# Patient Record
Sex: Male | Born: 1935 | Race: White | Hispanic: No | Marital: Married | Smoking: Former smoker
Health system: Southern US, Community
[De-identification: ages and names within clinical notes are randomized; demographics above are authoritative.]

## PROBLEM LIST (undated history)

## (undated) DIAGNOSIS — C801 Malignant (primary) neoplasm, unspecified: Secondary | ICD-10-CM

## (undated) DIAGNOSIS — I10 Essential (primary) hypertension: Secondary | ICD-10-CM

---

## 2000-03-26 ENCOUNTER — Encounter: Admission: RE | Admit: 2000-03-26 | Discharge: 2000-03-26 | Payer: Self-pay

## 2000-04-16 ENCOUNTER — Ambulatory Visit (HOSPITAL_COMMUNITY): Admission: RE | Admit: 2000-04-16 | Discharge: 2000-04-16 | Payer: Self-pay | Admitting: Gastroenterology

## 2008-10-18 ENCOUNTER — Encounter: Admission: RE | Admit: 2008-10-18 | Discharge: 2008-10-18 | Payer: Self-pay | Admitting: Family Medicine

## 2008-11-29 ENCOUNTER — Inpatient Hospital Stay (HOSPITAL_COMMUNITY): Admission: RE | Admit: 2008-11-29 | Discharge: 2008-12-02 | Payer: Self-pay | Admitting: Orthopedic Surgery

## 2010-09-25 LAB — CROSSMATCH
ABO/RH(D): B POS
Antibody Screen: NEGATIVE

## 2010-09-25 LAB — BASIC METABOLIC PANEL
BUN: 12 mg/dL (ref 6–23)
BUN: 8 mg/dL (ref 6–23)
BUN: 8 mg/dL (ref 6–23)
CO2: 28 mEq/L (ref 19–32)
CO2: 29 mEq/L (ref 19–32)
Calcium: 8 mg/dL — ABNORMAL LOW (ref 8.4–10.5)
Calcium: 8.2 mg/dL — ABNORMAL LOW (ref 8.4–10.5)
Chloride: 100 mEq/L (ref 96–112)
Chloride: 102 mEq/L (ref 96–112)
Creatinine, Ser: 0.78 mg/dL (ref 0.4–1.5)
Creatinine, Ser: 0.79 mg/dL (ref 0.4–1.5)
Creatinine, Ser: 0.72 mg/dL (ref 0.4–1.5)
GFR calc Af Amer: >60 mL/min (ref >60)
GFR calc Af Amer: >60 mL/min (ref >60)
GFR calc non Af Amer: >60 mL/min (ref >60)
GFR calc non Af Amer: >60 mL/min (ref >60)
GFR calc non Af Amer: >60 mL/min (ref >60)
Glucose, Bld: 111 mg/dL — ABNORMAL HIGH (ref 70–99)
Glucose, Bld: 149 mg/dL — ABNORMAL HIGH (ref 70–99)
Glucose, Bld: 126 mg/dL — ABNORMAL HIGH (ref 70–99)
Potassium: 3.4 mEq/L — ABNORMAL LOW (ref 3.5–5.1)
Potassium: 4 mEq/L (ref 3.5–5.1)
Potassium: 3.3 mEq/L — ABNORMAL LOW (ref 3.5–5.1)
Sodium: 135 mEq/L (ref 135–145)
Sodium: 136 mEq/L (ref 135–145)

## 2010-09-25 LAB — CBC
HCT: 27.8 % — ABNORMAL LOW (ref 39.0–52.0)
HCT: 31.1 % — ABNORMAL LOW (ref 39.0–52.0)
HCT: 41 % (ref 39.0–52.0)
Hemoglobin: 9.5 g/dL — ABNORMAL LOW (ref 13.0–17.0)
MCHC: 34.2 g/dL (ref 30.0–36.0)
MCV: 96.7 fL (ref 78.0–100.0)
MCV: 95.2 fL (ref 78.0–100.0)
MCV: 96 fL (ref 78.0–100.0)
Platelets: 191 10*3/uL (ref 150–400)
Platelets: 192 10*3/uL (ref 150–400)
Platelets: 211 10*3/uL (ref 150–400)
Platelets: 223 10*3/uL (ref 150–400)
RBC: 2.77 MIL/uL — ABNORMAL LOW (ref 4.22–5.81)
RBC: 2.87 MIL/uL — ABNORMAL LOW (ref 4.22–5.81)
RBC: 4.31 MIL/uL (ref 4.22–5.81)
RDW: 13.3 % (ref 11.5–15.5)
RDW: 13.6 % (ref 11.5–15.5)
WBC: 10.6 10*3/uL — ABNORMAL HIGH (ref 4.0–10.5)
WBC: 7.7 10*3/uL (ref 4.0–10.5)
WBC: 8.1 10*3/uL (ref 4.0–10.5)

## 2010-09-25 LAB — COMPREHENSIVE METABOLIC PANEL
AST: 19 U/L (ref 0–37)
Albumin: 3.5 g/dL (ref 3.5–5.2)
Alkaline Phosphatase: 69 U/L (ref 39–117)
BUN: 13 mg/dL (ref 6–23)
CO2: 27 mEq/L (ref 19–32)
Chloride: 102 mEq/L (ref 96–112)
Creatinine, Ser: 0.74 mg/dL (ref 0.4–1.5)
GFR calc Af Amer: >60 mL/min (ref >60)
GFR calc non Af Amer: >60 mL/min (ref >60)
Potassium: 3.9 mEq/L (ref 3.5–5.1)
Total Bilirubin: 0.7 mg/dL (ref 0.3–1.2)

## 2010-09-25 LAB — URINE CULTURE
Colony Count: NO GROWTH
Culture: NO GROWTH

## 2010-09-25 LAB — URINALYSIS, ROUTINE W REFLEX MICROSCOPIC
Bilirubin Urine: NEGATIVE
Glucose, UA: NEGATIVE mg/dL
Hgb urine dipstick: NEGATIVE
Ketones, ur: NEGATIVE mg/dL
Protein, ur: NEGATIVE mg/dL
pH: 5.5 (ref 5.0–8.0)

## 2010-09-25 LAB — PROTIME-INR: Prothrombin Time: 15.3 seconds — ABNORMAL HIGH (ref 11.6–15.2)

## 2010-09-25 LAB — DIFFERENTIAL
Basophils Absolute: 0 10*3/uL (ref 0.0–0.1)
Basophils Relative: 0 % (ref 0–1)
Eosinophils Absolute: 0 10*3/uL (ref 0.0–0.7)
Eosinophils Relative: 0 % (ref 0–5)
Lymphocytes Relative: 18 % (ref 12–46)
Monocytes Absolute: 0.9 10*3/uL (ref 0.1–1.0)

## 2010-10-31 NOTE — Op Note (Signed)
NAMEERMON, SAGAN NO.:  [id number]   MEDICAL RECORD NO.:  [id number]          PATIENT TYPE:  INP   LOCATION:  2899                         FACILITY:  [facility]   PHYSICIAN:  Mila Homer. Sherlean Foot, M.D. DATE OF BIRTH:  17-Dec-1927   DATE OF PROCEDURE:  DATE OF DISCHARGE:                               OPERATIVE REPORT   SURGEON:  Mila Homer. Sherlean Foot, MD   ASSISTANTS:  1. Altamese Cabal, PA-C  2. Skip Mayer, PA-C   ANESTHESIA:  General.   PREOPERATIVE DIAGNOSIS:  Right knee osteoarthritis.   POSTOPERATIVE DIAGNOSIS:  Right knee osteoarthritis.   PROCEDURE:  Right total knee arthroplasty.   INDICATIONS FOR PROCEDURE:  The patient is a 77 year old white male with  failure conservative measures for osteoarthritis of the right knee.  Informed consent obtained.   DESCRIPTION OF PROCEDURE:  The patient was laid supine, administered  general anesthesia, and right leg was prepped and draped in usual  sterile fashion.  A midline incision was made with a #10 blade.  Nu-  Blade was used to make a median parapatellar arthrotomy to perform a  synovectomy.  I elevated the deep MCL off the medial crest of the tibia  around the semimembranosus tendon.  I then everted the patella and  measured 26 mm thick.  I reamed down 10 mm, drilled 3 lug holes through  the 35-mm template.  I then removed the trial patella and went into  flexion.  I used the extramedullary alignment system on the tibia to  make perpendicular cut to the anatomic axis.  I then used the  intramedullary drill in the femur and used to 6-degree valgus  intramedullary alignment and fastened a distal femoral cutting block in  6 degrees of valgus.  I made the distal femoral cut with sagittal saw.  I marked out the epicondylar axis.  I sized to a size D.  There was a  definite anterior to posterior to medial and lateral mismatch, but I  went with the D anyway to avoid notching.  I then made the anterior-  posterior  chamfer cuts through the cutting block.  At this point, I then  placed a 10-mm spacer block in the knee.  Had good flexion and extension  gap balance.  I removed the ACL, PCL, medial and lateral menisci,  posterior condylar osteophytes.  I then finished the tibia with size 5  tibial tray drilling keel and finished the femur with a size D finishing  block.  I then trialed with the D femur, 5 tibia, 10- and 12-mm poly and  chose the 12, and a 35-mm patella and had good flexion and extension gap  balance, good patellar tracking.  I then removed the trial component and  copiously irrigated.  I then cemented the components removing excess  cement.  I allowed the cement to hard in extension, placed a Hemovac  coming out superolateral and deep to the arthrotomy, pain catheter  coming out superomedially and superficial to the arthrotomy.  Let  tourniquet down.  Once the cement was hardened, we obtained hemostasis,  copiously irrigated.  I then closed the arthrotomy with figure-of-eight  #1 Vicryl sutures, closed the deep soft tissues with buried 0 Vicryl  sutures, subcuticular 2-0 Vicryl stitch, and skin with staples.  Dressed  with Xeroform dressing, sponges, sterile Webril, and TED stocking.   COMPLICATIONS:  None.   DRAINS:  One Hemovac, one Foley catheter.   ESTIMATED BLOOD LOSS:  300 mL.   TOURNIQUET TIME:  52 minutes.            ______________________________  Mila Homer Sherlean Foot, M.D.     SDL/MEDQ  D:  11/29/2008  T:  11/30/2008  Job:  [id number]

## 2010-11-03 NOTE — Procedures (Signed)
[facility]  Patient:    [name] Ferrell, [name] Ferrell                     MRN: [id number] Proc. Date: 04/16/00 Adm. Date:  [id number] Attending:  Orland Mustard CC:         Milus Mallick, M.D.  Arvella Merles, M.D.   Procedure Report  PROCEDURE:  Colonoscopy.  MEDICATIONS:  Fentanyl 75 mcg, Versed 6 mg IV.  SCOPE:  Pediatric video colonoscope.  INDICATION:  A nice man who has had abdominal pain.  CT scan showed a soft tissue density in the inferior transverse mesocolon that was possibly diverticulitis.  He has diffuse diverticular disease.  Cannot be sure this is not a tumor.  He has had some sort of infected umbilical herniation.  Given the abnormal CT, colonoscopy is performed to rule out neoplasm or active diverticulitis in that area.  DESCRIPTION OF PROCEDURE:  The procedure had been explained to the patient and consent obtained.  With the patient in the left lateral decubitus position, the pediatric video colonoscope was inserted and advanced under direct visualization.  The prep was adequate.  The patient did have still retained large balls of stool throughout the colon despite the prep.  We were able to advance over to the cecum without a large amount of difficulty.  Although there was pan-diverticular disease throughout, particularly in the sigmoid and left colon, we were able to pass the scope easily.  The ileocecal valve was seen.  The scope was withdrawn, and there were diverticula seen in the right colon, a fair amount of diverticular in the transverse colon but no active diverticulitis or tumor mass.  The descending and sigmoid colon also showed multiple large- and small-mouthed diverticula, no polyps or other lesions. Scope withdrawn.  The patient tolerated the procedure well.  ASSESSMENT:  Pan-diverticulosis with no evidence of active diverticulitis or tumor.  PLAN:  Will treat conservatively with fiber b.i.d., give a handout  on diverticular disease, and see back in the office in two months. DD:  04/16/00 TD:  04/16/00 Job: [id number]

## 2010-11-03 NOTE — Discharge Summary (Signed)
[name], [name] NO.:  [id number]   MEDICAL RECORD NO.:  [id number]          PATIENT TYPE:  INP   LOCATION:  5019                         FACILITY:  [facility]   PHYSICIAN:  Mila Homer. Sherlean Foot, M.D. DATE OF BIRTH:  1928/04/15   DATE OF ADMISSION:  11/29/2008  DATE OF DISCHARGE:  12/02/2008                               DISCHARGE SUMMARY   ADMISSION DIAGNOSIS:  Osteoarthritis of the right knee.   DISCHARGE DIAGNOSES:  1. Osteoarthritis of the right knee, status post right total knee      arthroplasty.  2. Acute blood loss anemia status post surgery.  3. Benign prostatic hypertrophy, trouble voiding.   PROCEDURE:  Right total knee arthroplasty.   HISTORY:  The patient is a 77 year old male with complaints of constant  severe pain in the right knee.  Pain started to interfere with  activities of daily living.  Conservative treatment has failed.  Risks  and benefits of surgery were discussed with the patient.  The patient  would like to proceed with the right total knee.   ALLERGIES:  The patient has no known drug allergies.   ADMISSION MEDICATIONS:  Upon admission to the hospital, the patient was  taking Benadryl 25 mg at bedtime; vitamin C 500 daily; Tylenol 650 day  and night, Extended Release; Omega-3 fish oil 1000 daily; fiber  laxatives in the morning; glucose chondroitin once in the morning, once  at night; vitamin D3 2000 daily, B12 250 mcg daily; aspirin 81 mg daily;  __________ 25 daily; lisinopril 10 daily; Colace at bedtime.   HOSPITAL COURSE:  This is a 77 year old male admitted to the hospital on  November 29, 2008.  After appropriate laboratory studies were taken  preoperatively as well as Ancef on-call to the operating room, he was  taken to the OR where he underwent a right TKA.  He tolerated the  procedure well and was taken to the PACU in good condition.  The patient  was placed on p.o. pain medications as well as Dilaudid as needed IV and  Foley  was placed intraoperatively.   Postop day #1 vital signs were stable.  The patient denied chest pain,  shortness of breath, or calf pain.  The patient was started on Lovenox  30 mg subcu q.12 h. at 8 a.m.  Consults to PT, OT, and Care Management  were made.  The patient is weightbearing as tolerated.  CPM 0 to 90  degrees 6-8 hours per day.  Studies were incentive spirometry teaching.   Postop day #2, the patient continued progressive physical therapy.  Dressing was changed.  Marcaine pump and Hemovac were discontinued.  Foley was discontinued, but then re-continued because of trouble  voiding.  He was also given some Flomax to help void.   The patient continued to progress in physical therapy postop day #3.  Fosamax helped and was voiding normally and was able to be discharged  after Foley was discontinued, and he was voiding normally.  The patient  was discharged home after Lovenox teaching.   LABORATORY STUDIES:  Postop day #1, white blood cell count  9.2, H and H  10.8 and 31.1, platelets were 124.  Sodium was 134, potassium was 3.3,  he was given Kcl p.o., chloride was 99, CO2 was 31, BUN was 8,  creatinine was 0.72, glucose was 126.  Upon discharge from the hospital,  the patient's white blood cell count was 7.7, H and H was 9.2 and 26.5,  platelets 191.  Sodium was 136, potassium was 4.0, 102 of chloride, CO2  was 29, BUN was 8, creatinine 0.78, glucose was 111.   DISCHARGE INSTRUCTIONS:  There are no restrictions to diet.  He is to  follow blue instruction sheet for wound care.  Increase activity slowly.  May use a cane or walker, weightbearing as tolerated.  No lifting or  driving for 6 weeks.  Home health CPM 0 to 90 degrees for 6-8 hours per  day and compression hose bilateral x3 weeks.   Upon discharge from the hospital, the patient was given Lovenox 40 mg,  inject one subcu daily, last dose December 13, 2008; Vicodin 5/500 one to  two tablets every 4-6 hours as needed for  pain, #60; Robaxin 500 mg one  to two tablets every 6-8 hours as needed for spasm; and was also  continued on some Flomax 0.4 mg one daily x2 weeks.   The patient will follow with Dr. Sherlean Foot on December 14, 2008.  Call for  appointment [phone].  Discharged in improved condition.     ______________________________  Altamese Cabal, PA-C    ______________________________  Mila Homer. Sherlean Foot, M.D.    MJ/MEDQ  D:  01/14/2009  T:  01/15/2009  Job:  [id number]

## 2014-06-13 ENCOUNTER — Emergency Department (HOSPITAL_COMMUNITY): Payer: Commercial Managed Care - HMO

## 2014-06-13 ENCOUNTER — Inpatient Hospital Stay (HOSPITAL_COMMUNITY)
Admission: EM | Admit: 2014-06-13 | Discharge: 2014-06-21 | DRG: 660 | Disposition: A | Discharge status: Skilled Nursing Facility | Payer: Commercial Managed Care - HMO | Attending: Internal Medicine | Admitting: Internal Medicine

## 2014-06-13 ENCOUNTER — Encounter (HOSPITAL_COMMUNITY): Payer: Self-pay | Admitting: *Deleted

## 2014-06-13 DIAGNOSIS — C61 Malignant neoplasm of prostate: Secondary | ICD-10-CM | POA: Diagnosis present

## 2014-06-13 DIAGNOSIS — R531 Weakness: Secondary | ICD-10-CM

## 2014-06-13 DIAGNOSIS — Z801 Family history of malignant neoplasm of trachea, bronchus and lung: Secondary | ICD-10-CM | POA: Diagnosis not present

## 2014-06-13 DIAGNOSIS — E875 Hyperkalemia: Secondary | ICD-10-CM | POA: Diagnosis present

## 2014-06-13 DIAGNOSIS — N139 Obstructive and reflux uropathy, unspecified: Secondary | ICD-10-CM | POA: Diagnosis present

## 2014-06-13 DIAGNOSIS — D62 Acute posthemorrhagic anemia: Secondary | ICD-10-CM

## 2014-06-13 DIAGNOSIS — N131 Hydronephrosis with ureteral stricture, not elsewhere classified: Secondary | ICD-10-CM

## 2014-06-13 DIAGNOSIS — R339 Retention of urine, unspecified: Secondary | ICD-10-CM | POA: Diagnosis present

## 2014-06-13 DIAGNOSIS — N320 Bladder-neck obstruction: Secondary | ICD-10-CM | POA: Diagnosis present

## 2014-06-13 DIAGNOSIS — I10 Essential (primary) hypertension: Secondary | ICD-10-CM | POA: Diagnosis present

## 2014-06-13 DIAGNOSIS — Z7982 Long term (current) use of aspirin: Secondary | ICD-10-CM | POA: Diagnosis not present

## 2014-06-13 DIAGNOSIS — N390 Urinary tract infection, site not specified: Secondary | ICD-10-CM | POA: Diagnosis not present

## 2014-06-13 DIAGNOSIS — Z87891 Personal history of nicotine dependence: Secondary | ICD-10-CM | POA: Diagnosis not present

## 2014-06-13 DIAGNOSIS — N179 Acute kidney failure, unspecified: Secondary | ICD-10-CM | POA: Diagnosis present

## 2014-06-13 DIAGNOSIS — N1330 Unspecified hydronephrosis: Secondary | ICD-10-CM | POA: Diagnosis present

## 2014-06-13 DIAGNOSIS — Z807 Family history of other malignant neoplasms of lymphoid, hematopoietic and related tissues: Secondary | ICD-10-CM | POA: Diagnosis not present

## 2014-06-13 DIAGNOSIS — C7989 Secondary malignant neoplasm of other specified sites: Secondary | ICD-10-CM

## 2014-06-13 DIAGNOSIS — R197 Diarrhea, unspecified: Secondary | ICD-10-CM | POA: Diagnosis present

## 2014-06-13 DIAGNOSIS — E872 Acidosis: Secondary | ICD-10-CM | POA: Diagnosis present

## 2014-06-13 DIAGNOSIS — R310 Gross hematuria: Secondary | ICD-10-CM | POA: Diagnosis present

## 2014-06-13 HISTORY — DX: Essential (primary) hypertension: I10

## 2014-06-13 HISTORY — DX: Malignant (primary) neoplasm, unspecified: C80.1

## 2014-06-13 LAB — CBC WITH DIFFERENTIAL/PLATELET
Basophils Absolute: 0 10*3/uL (ref 0.0–0.1)
Basophils Relative: 0 % (ref 0–1)
EOS ABS: 0.1 10*3/uL (ref 0.0–0.7)
EOS PCT: 1 % (ref 0–5)
HEMATOCRIT: 26.7 % — AB (ref 39.0–52.0)
Hemoglobin: 8.9 g/dL — ABNORMAL LOW (ref 13.0–17.0)
LYMPHS ABS: 1 10*3/uL (ref 0.7–4.0)
LYMPHS PCT: 7 % — AB (ref 12–46)
MCH: 29.3 pg (ref 26.0–34.0)
MCHC: 33.3 g/dL (ref 30.0–36.0)
MCV: 87.8 fL (ref 78.0–100.0)
MONO ABS: 1.1 10*3/uL — AB (ref 0.1–1.0)
MONOS PCT: 7 % (ref 3–12)
Neutro Abs: 12.8 10*3/uL — ABNORMAL HIGH (ref 1.7–7.7)
Neutrophils Relative %: 85 % — ABNORMAL HIGH (ref 43–77)
PLATELETS: 345 10*3/uL (ref 150–400)
RBC: 3.04 MIL/uL — AB (ref 4.22–5.81)
RDW: 14.6 % (ref 11.5–15.5)
WBC: 15 10*3/uL — ABNORMAL HIGH (ref 4.0–10.5)

## 2014-06-13 LAB — URINALYSIS, ROUTINE W REFLEX MICROSCOPIC
Glucose, UA: NEGATIVE mg/dL
Ketones, ur: 15 mg/dL — AB
NITRITE: POSITIVE — AB
PROTEIN: 100 mg/dL — AB
SPECIFIC GRAVITY, URINE: 1.016 (ref 1.005–1.030)
UROBILINOGEN UA: 0.2 mg/dL (ref 0.0–1.0)
pH: 5.5 (ref 5.0–8.0)

## 2014-06-13 LAB — COMPREHENSIVE METABOLIC PANEL
ALT: 18 U/L (ref 0–53)
ANION GAP: 20 — AB (ref 5–15)
AST: 16 U/L (ref 0–37)
Albumin: 2.7 g/dL — ABNORMAL LOW (ref 3.5–5.2)
Alkaline Phosphatase: 88 U/L (ref 39–117)
BUN: 145 mg/dL — AB (ref 6–23)
CALCIUM: 8.5 mg/dL (ref 8.4–10.5)
CO2: 15 mmol/L — AB (ref 19–32)
CREATININE: 13.41 mg/dL — AB (ref 0.50–1.35)
Chloride: 96 mEq/L (ref 96–112)
GFR, EST AFRICAN AMERICAN: 3 mL/min — AB (ref >90)
GFR, EST NON AFRICAN AMERICAN: 3 mL/min — AB (ref >90)
GLUCOSE: 81 mg/dL (ref 70–99)
Potassium: 6.5 mmol/L (ref 3.5–5.1)
Sodium: 131 mmol/L — ABNORMAL LOW (ref 135–145)
TOTAL PROTEIN: 6.4 g/dL (ref 6.0–8.3)
Total Bilirubin: 1.6 mg/dL — ABNORMAL HIGH (ref 0.3–1.2)

## 2014-06-13 LAB — URINE MICROSCOPIC-ADD ON

## 2014-06-13 MED ORDER — INSULIN ASPART 100 UNIT/ML IV SOLN
5.0000 [IU] | Freq: Once | INTRAVENOUS | Status: AC
  Administered 2014-06-13: 5 [IU] via INTRAVENOUS
  Filled 2014-06-13: qty 1

## 2014-06-13 MED ORDER — SODIUM BICARBONATE 8.4 % IV SOLN
50.0000 meq | Freq: Once | INTRAVENOUS | Status: AC
  Administered 2014-06-13: 50 meq via INTRAVENOUS
  Filled 2014-06-13: qty 50

## 2014-06-13 MED ORDER — ALBUTEROL SULFATE (2.5 MG/3ML) 0.083% IN NEBU
5.0000 mg | INHALATION_SOLUTION | Freq: Once | RESPIRATORY_TRACT | Status: AC
  Administered 2014-06-13: 5 mg via RESPIRATORY_TRACT
  Filled 2014-06-13: qty 6

## 2014-06-13 MED ORDER — MORPHINE SULFATE 4 MG/ML IJ SOLN
4.0000 mg | Freq: Once | INTRAMUSCULAR | Status: AC
  Administered 2014-06-13: 4 mg via INTRAVENOUS
  Filled 2014-06-13: qty 1

## 2014-06-13 MED ORDER — DEXTROSE 5 % IV SOLN
1.0000 g | Freq: Once | INTRAVENOUS | Status: AC
  Administered 2014-06-13: 1 g via INTRAVENOUS
  Filled 2014-06-13: qty 10

## 2014-06-13 MED ORDER — SODIUM CHLORIDE 0.9 % IV BOLUS (SEPSIS)
500.0000 mL | Freq: Once | INTRAVENOUS | Status: AC
  Administered 2014-06-13: 500 mL via INTRAVENOUS

## 2014-06-13 MED ORDER — DEXTROSE 50 % IV SOLN
1.0000 | Freq: Once | INTRAVENOUS | Status: AC
  Administered 2014-06-13: 50 mL via INTRAVENOUS
  Filled 2014-06-13: qty 50

## 2014-06-13 NOTE — ED Notes (Signed)
The pt has been diagnosed with prostate caner  For the second episode.  He has not been able to void since last pm.  He has swollen legs and body weakness.

## 2014-06-13 NOTE — ED Notes (Signed)
Dr. Alvino Chapel at bedside, notified of critical potassium.

## 2014-06-13 NOTE — ED Notes (Signed)
MD at bedside. 

## 2014-06-13 NOTE — ED Notes (Signed)
Dr. Roel Cluck, hospitalist, at the bedside.

## 2014-06-13 NOTE — H&P (Signed)
PCP: [facility]   Chief Complaint:  No urine output  HPI: [name] Ferrell is a 80 y.o. male   has a past medical history of Cancer and Hypertension.   Presented with  Patient report difficulty with urination last time he urinated was 3 days ago. Patient was noting that his ankles were swelling.  Patient Presented to ER and Foley was placed producing at least 2.5 of dark brown urine. He was found to have cr of 13.41 and K of 6.5. He was treated with albuterol, insulin and d50, bicarb Patient reports he have had some diarrhea and so did his wife but she was tested and found to be c.dif negative.  Patient has hx of prostate cancer followed by urology with recently rising PSA and worsening hematouria  Hospitalist was called for admission for acute renal failure due to obstruction  Review of Systems:    Pertinent positives include:  chills, no urine output, diarrhea,   Constitutional:  No weight loss, night sweats, Fevers, fatigue, weight loss  HEENT:  No headaches, Difficulty swallowing,Tooth/dental problems,Sore throat,  No sneezing, itching, ear ache, nasal congestion, post nasal drip,  Cardio-vascular:  No chest pain, Orthopnea, PND, anasarca, dizziness, palpitations.no Bilateral lower extremity swelling  GI:  No heartburn, indigestion, abdominal pain, nausea, vomiting, change in bowel habits, loss of appetite, melena, blood in stool, hematemesis Resp:  no shortness of breath at rest. No dyspnea on exertion, No excess mucus, no productive cough, No non-productive cough, No coughing up of blood.No change in color of mucus.No wheezing. Skin:  no rash or lesions. No jaundice GU:  no dysuria, change in color of urine, no urgency or frequency. No straining to urinate.  No flank pain.  Musculoskeletal:  No joint pain or no joint swelling. No decreased range of motion. No back pain.  Psych:  No change in mood or affect. No depression or anxiety. No memory loss.    Neuro: no localizing neurological complaints, no tingling, no weakness, no double vision, no gait abnormality, no slurred speech, no confusion  Otherwise ROS are negative except for above, 10 systems were reviewed  Past Medical History: Past Medical History  Diagnosis Date  . Cancer   . Hypertension    History reviewed. No pertinent past surgical history.   Medications: Prior to Admission medications   Medication Sig Start Date End Date Taking? Authorizing Provider  aspirin 81 MG tablet Take 81 mg by mouth daily.   Yes Historical Provider, MD  lisinopril (PRINIVIL,ZESTRIL) 10 MG tablet Take 10 mg by mouth daily.   Yes Historical Provider, MD    Allergies:   Allergies  Allergen Reactions  . 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 He does not have any smokeless tobacco history on file. He reports that he drinks alcohol. He reports that he does not use illicit drugs.    Family History: family history includes Lung cancer in his father; Lymphoma in his mother.    Physical Exam: Patient Vitals for the past 24 hrs:  BP Temp Temp src Pulse Resp SpO2 Height Weight  06/13/14 2315 (!) 133/48 mmHg - - 95 18 99 % - -  06/13/14 2247 (!) 132/52 mmHg - - 96 20 94 % - -  06/13/14 2200 132/56 mmHg - - 83 12 100 % - -  06/13/14 2145 Marland Kitchen)  131/51 mmHg - - 81 15 100 % - -  06/13/14 2132 - - - 84 14 99 % - -  06/13/14 2131 (!) 134/48 mmHg - - - 19 - - -  06/13/14 2045 (!) 129/54 mmHg 97.9 F (36.6 C) Oral 88 16 100 % 5\' 11"  (1.803 m) 78.472 kg (173 lb)    1. General:  in No Acute distress 2. Psychological: Alert and  Oriented 3. Head/ENT:   Dry Mucous Membranes                          Head Non traumatic, neck supple                          Normal   Dentition 4. SKIN:   decreased Skin turgor,  Skin clean Dry and intact no rash 5. Heart: Regular rate and rhythm no  Murmur, Rub or gallop 6. Lungs: Clear to auscultation bilaterally, no wheezes or crackles   7. Abdomen: Soft, non-tender, Non distended 8. Lower extremities: no clubbing, cyanosis, trace edema 9. Neurologically Grossly intact, moving all 4 extremities equally 10. MSK: Normal range of motion  body mass index is 24.14 kg/(m^2).   Labs on Admission:   Results for orders placed or performed during the hospital encounter of 06/13/14 (from the past 24 hour(s))  CBC with Differential     Status: Abnormal   Collection Time: 06/13/14  9:21 PM  Result Value Ref Range   WBC 15.0 (H) 4.0 - 10.5 K/uL   RBC 3.04 (L) 4.22 - 5.81 MIL/uL   Hemoglobin 8.9 (L) 13.0 - 17.0 g/dL   HCT 26.7 (L) 39.0 - 52.0 %   MCV 87.8 78.0 - 100.0 fL   MCH 29.3 26.0 - 34.0 pg   MCHC 33.3 30.0 - 36.0 g/dL   RDW 14.6 11.5 - 15.5 %   Platelets 345 150 - 400 K/uL   Neutrophils Relative % 85 (H) 43 - 77 %   Neutro Abs 12.8 (H) 1.7 - 7.7 K/uL   Lymphocytes Relative 7 (L) 12 - 46 %   Lymphs Abs 1.0 0.7 - 4.0 K/uL   Monocytes Relative 7 3 - 12 %   Monocytes Absolute 1.1 (H) 0.1 - 1.0 K/uL   Eosinophils Relative 1 0 - 5 %   Eosinophils Absolute 0.1 0.0 - 0.7 K/uL   Basophils Relative 0 0 - 1 %   Basophils Absolute 0.0 0.0 - 0.1 K/uL  Comprehensive metabolic panel     Status: Abnormal   Collection Time: 06/13/14  9:21 PM  Result Value Ref Range   Sodium 131 (L) 135 - 145 mmol/L   Potassium 6.5 (HH) 3.5 - 5.1 mmol/L   Chloride 96 96 - 112 mEq/L   CO2 15 (L) 19 - 32 mmol/L   Glucose, Bld 81 70 - 99 mg/dL   BUN 145 (H) 6 - 23 mg/dL   Creatinine, Ser 13.41 (H) 0.50 - 1.35 mg/dL   Calcium 8.5 8.4 - 10.5 mg/dL   Total Protein 6.4 6.0 - 8.3 g/dL   Albumin 2.7 (L) 3.5 - 5.2 g/dL   AST 16 0 - 37 U/L   ALT 18 0 - 53 U/L   Alkaline Phosphatase 88 39 - 117 U/L   Total Bilirubin 1.6 (H) 0.3 - 1.2 mg/dL   GFR calc non Af Amer 3 (L) >90 mL/min   GFR calc Af Amer 3 (L) >90  mL/min   Anion gap 20 (H) 5 - 15  Urinalysis,  Routine w reflex microscopic     Status: Abnormal   Collection Time: 06/13/14  9:49 PM  Result Value Ref Range   Color, Urine BROWN (A) YELLOW   APPearance TURBID (A) CLEAR   Specific Gravity, Urine 1.016 1.005 - 1.030   pH 5.5 5.0 - 8.0   Glucose, UA NEGATIVE NEGATIVE mg/dL   Hgb urine dipstick LARGE (A) NEGATIVE   Bilirubin Urine LARGE (A) NEGATIVE   Ketones, ur 15 (A) NEGATIVE mg/dL   Protein, ur 100 (A) NEGATIVE mg/dL   Urobilinogen, UA 0.2 0.0 - 1.0 mg/dL   Nitrite POSITIVE (A) NEGATIVE   Leukocytes, UA LARGE (A) NEGATIVE  Urine microscopic-add on     Status: Abnormal   Collection Time: 06/13/14  9:49 PM  Result Value Ref Range   Squamous Epithelial / LPF RARE RARE   WBC, UA TOO NUMEROUS TO COUNT <3 WBC/hpf   RBC / HPF TOO NUMEROUS TO COUNT <3 RBC/hpf   Bacteria, UA MANY (A) RARE    UA Too many to count RBC and WBC  No results found for: HGBA1C  Estimated Creatinine Clearance: 4.4 mL/min (by C-G formula based on Cr of 13.41).  BNP (last 3 results) No results for input(s): PROBNP in the last 8760 hours.  Other results:  I have pearsonaly reviewed this: ECG REPORT  Rate: 88   Rhythm: NSR ST&T Change: no ischemic changes   Filed Weights   06/13/14 2045  Weight: 78.472 kg (173 lb)     Cultures:    Component Value Date/Time   SDES URINE, CLEAN CATCH 11/24/2008 0945   SPECREQUEST NONE 11/24/2008 0945   CULT NO GROWTH 11/24/2008 0945   REPTSTATUS 11/25/2008 FINAL 11/24/2008 0945     Radiological Exams on Admission: Dg Abd Acute W/chest  06/13/2014   CLINICAL DATA:  Difficulty urinating for 2 days. Diarrhea for 3 days. Low abdominal pain.  EXAM: ACUTE ABDOMEN SERIES (ABDOMEN 2 VIEW & CHEST 1 VIEW)  COMPARISON:  Chest x-ray 10/18/2008  FINDINGS: There is hyperinflation of the lungs compatible with COPD. Chronic changes/ scarring in the lungs. Heart is normal size. No acute airspace opacities or effusions.  Nonobstructive bowel gas pattern. No free air. No  organomegaly or suspicious calcification.  No acute bony abnormality. Degenerative changes throughout the thoracolumbar spine.  IMPRESSION: No evidence of bowel obstruction or free air.  COPD/chronic changes in the lungs.  No active disease.   Electronically Signed   By: Rolm Baptise M.D.   On: 06/13/2014 22:45    Chart has been reviewed  Assessment/Plan  80 year old male with history of prostate cancer here with bladder obstruction resulting in acute renal failure and hyperkalemia  Present on Admission:  . Urinary obstruction - now status post Foley catheter, urology consult in a.m.  . Acute renal failure -secondary to urine outlet obstruction. We'll continue foley, obtain renal ultrasound, gentle IV fluids, obtain urine electrolytes, if no significant improvement will need renal consult  . Hyperkalemia - patient has been treated for Will repeat bmet  monitor on telemetry  . UTI (lower urinary tract infection) - treat with rocephin   Prophylaxis: SCD , Protonix  CODE STATUS:  FULL CODE    Other plan as per orders.  I have spent a total of 55 min on this admission  Daunte Oestreich 06/13/2014, 11:22 PM  [facility]  Pager [phone]   after 2 AM please page floor coverage PA If  7AM-7PM, please contact the day team taking care of the patient  [email or web address]  Password [email or web address]

## 2014-06-13 NOTE — ED Notes (Signed)
The pt has multiple drug allergies

## 2014-06-14 ENCOUNTER — Encounter (HOSPITAL_COMMUNITY): Payer: Self-pay | Admitting: *Deleted

## 2014-06-14 ENCOUNTER — Other Ambulatory Visit: Payer: Self-pay | Admitting: Diagnostic Radiology

## 2014-06-14 ENCOUNTER — Inpatient Hospital Stay (HOSPITAL_COMMUNITY): Payer: Commercial Managed Care - HMO

## 2014-06-14 DIAGNOSIS — N1330 Unspecified hydronephrosis: Secondary | ICD-10-CM

## 2014-06-14 DIAGNOSIS — R338 Other retention of urine: Secondary | ICD-10-CM

## 2014-06-14 LAB — BASIC METABOLIC PANEL
ANION GAP: 16 — AB (ref 5–15)
ANION GAP: 9 (ref 5–15)
Anion gap: 10 (ref 5–15)
BUN: 121 mg/dL — ABNORMAL HIGH (ref 6–23)
BUN: 88 mg/dL — ABNORMAL HIGH (ref 6–23)
BUN: 84 mg/dL — AB (ref 6–23)
CHLORIDE: 103 meq/L (ref 96–112)
CHLORIDE: 109 meq/L (ref 96–112)
CHLORIDE: 107 meq/L (ref 96–112)
CO2: 17 mmol/L — AB (ref 19–32)
CO2: 21 mmol/L (ref 19–32)
CO2: 20 mmol/L (ref 19–32)
CREATININE: 9.13 mg/dL — AB (ref 0.50–1.35)
CREATININE: 5.52 mg/dL — AB (ref 0.50–1.35)
Calcium: 8.1 mg/dL — ABNORMAL LOW (ref 8.4–10.5)
Calcium: 8.3 mg/dL — ABNORMAL LOW (ref 8.4–10.5)
Calcium: 8.2 mg/dL — ABNORMAL LOW (ref 8.4–10.5)
Creatinine, Ser: 5.19 mg/dL — ABNORMAL HIGH (ref 0.50–1.35)
GFR calc Af Amer: 5 mL/min — ABNORMAL LOW (ref >90)
GFR calc Af Amer: 10 mL/min — ABNORMAL LOW (ref >90)
GFR calc non Af Amer: 5 mL/min — ABNORMAL LOW (ref >90)
GFR calc non Af Amer: 8 mL/min — ABNORMAL LOW (ref >90)
GFR calc non Af Amer: 9 mL/min — ABNORMAL LOW (ref >90)
GFR, EST AFRICAN AMERICAN: 11 mL/min — AB (ref >90)
GLUCOSE: 74 mg/dL (ref 70–99)
GLUCOSE: 122 mg/dL — AB (ref 70–99)
GLUCOSE: 136 mg/dL — AB (ref 70–99)
Potassium: 5 mmol/L (ref 3.5–5.1)
Potassium: 4.5 mmol/L (ref 3.5–5.1)
Potassium: 4.9 mmol/L (ref 3.5–5.1)
Sodium: 136 mmol/L (ref 135–145)
Sodium: 139 mmol/L (ref 135–145)
Sodium: 137 mmol/L (ref 135–145)

## 2014-06-14 LAB — CBC
HCT: 23.5 % — ABNORMAL LOW (ref 39.0–52.0)
HEMOGLOBIN: 8 g/dL — AB (ref 13.0–17.0)
MCH: 30.3 pg (ref 26.0–34.0)
MCHC: 34 g/dL (ref 30.0–36.0)
MCV: 89 fL (ref 78.0–100.0)
Platelets: 290 10*3/uL (ref 150–400)
RBC: 2.64 MIL/uL — ABNORMAL LOW (ref 4.22–5.81)
RDW: 14.6 % (ref 11.5–15.5)
WBC: 10.1 10*3/uL (ref 4.0–10.5)

## 2014-06-14 LAB — COMPREHENSIVE METABOLIC PANEL
ALT: 17 U/L (ref 0–53)
AST: 13 U/L (ref 0–37)
Albumin: 2.3 g/dL — ABNORMAL LOW (ref 3.5–5.2)
Alkaline Phosphatase: 74 U/L (ref 39–117)
Anion gap: 12 (ref 5–15)
BUN: 98 mg/dL — ABNORMAL HIGH (ref 6–23)
CALCIUM: 8.3 mg/dL — AB (ref 8.4–10.5)
CO2: 18 mmol/L — ABNORMAL LOW (ref 19–32)
Chloride: 109 mEq/L (ref 96–112)
Creatinine, Ser: 6.71 mg/dL — ABNORMAL HIGH (ref 0.50–1.35)
GFR calc Af Amer: 8 mL/min — ABNORMAL LOW (ref >90)
GFR calc non Af Amer: 7 mL/min — ABNORMAL LOW (ref >90)
Glucose, Bld: 100 mg/dL — ABNORMAL HIGH (ref 70–99)
POTASSIUM: 4.6 mmol/L (ref 3.5–5.1)
SODIUM: 139 mmol/L (ref 135–145)
TOTAL PROTEIN: 5.7 g/dL — AB (ref 6.0–8.3)
Total Bilirubin: 1.5 mg/dL — ABNORMAL HIGH (ref 0.3–1.2)

## 2014-06-14 LAB — SODIUM, URINE, RANDOM: Sodium, Ur: 59 mmol/L

## 2014-06-14 LAB — PHOSPHORUS: Phosphorus: 4.3 mg/dL (ref 2.3–4.6)

## 2014-06-14 LAB — TSH: TSH: 3.12 u[IU]/mL (ref 0.350–4.500)

## 2014-06-14 LAB — MAGNESIUM: MAGNESIUM: 2.5 mg/dL (ref 1.5–2.5)

## 2014-06-14 LAB — CREATININE, URINE, RANDOM: CREATININE, URINE: 108.48 mg/dL

## 2014-06-14 MED ORDER — ACETAMINOPHEN 325 MG PO TABS: 650.0000 mg | ORAL_TABLET | Freq: Four times a day (QID) | ORAL | Status: DC | PRN

## 2014-06-14 MED ORDER — SODIUM CHLORIDE 0.9 % IJ SOLN
3.0000 mL | Freq: Two times a day (BID) | INTRAMUSCULAR | Status: DC
  Administered 2014-06-14 – 2014-06-21 (×9): 3 mL via INTRAVENOUS

## 2014-06-14 MED ORDER — CEFTRIAXONE SODIUM IN DEXTROSE 20 MG/ML IV SOLN
1.0000 g | INTRAVENOUS | Status: AC
Start: 2014-06-14 — End: 2014-06-20
  Administered 2014-06-14 – 2014-06-20 (×7): 1 g via INTRAVENOUS
  Filled 2014-06-14 (×7): qty 50

## 2014-06-14 MED ORDER — MORPHINE SULFATE 2 MG/ML IJ SOLN
2.0000 mg | INTRAMUSCULAR | Status: DC | PRN
  Administered 2014-06-14 – 2014-06-17 (×6): 2 mg via INTRAVENOUS
  Filled 2014-06-14 (×6): qty 1

## 2014-06-14 MED ORDER — ONDANSETRON HCL 4 MG/2ML IJ SOLN: 4.0000 mg | Freq: Four times a day (QID) | INTRAMUSCULAR | Status: DC | PRN

## 2014-06-14 MED ORDER — DOCUSATE SODIUM 100 MG PO CAPS
100.0000 mg | ORAL_CAPSULE | Freq: Two times a day (BID) | ORAL | Status: DC
  Administered 2014-06-14 – 2014-06-21 (×14): 100 mg via ORAL
  Filled 2014-06-14 (×18): qty 1

## 2014-06-14 MED ORDER — ONDANSETRON HCL 4 MG PO TABS: 4.0000 mg | ORAL_TABLET | Freq: Four times a day (QID) | ORAL | Status: DC | PRN

## 2014-06-14 MED ORDER — ACETAMINOPHEN 650 MG RE SUPP: 650.0000 mg | Freq: Four times a day (QID) | RECTAL | Status: DC | PRN

## 2014-06-14 MED ORDER — SODIUM CHLORIDE 0.9 % IV SOLN: INTRAVENOUS | Status: DC

## 2014-06-14 MED ORDER — HYDROCODONE-ACETAMINOPHEN 5-325 MG PO TABS: 1.0000 | ORAL_TABLET | ORAL | Status: DC | PRN

## 2014-06-14 MED ORDER — HYDROCODONE-ACETAMINOPHEN 5-325 MG PO TABS
1.0000 | ORAL_TABLET | Freq: Four times a day (QID) | ORAL | Status: DC | PRN
  Administered 2014-06-14 – 2014-06-21 (×12): 1 via ORAL
  Filled 2014-06-14 (×12): qty 1

## 2014-06-14 NOTE — Progress Notes (Addendum)
PROGRESS NOTE    [name] Ferrell GYJ:[id number] DOB: 1928/01/14 DOA: 06/13/2014 PCP: [name], MD  Primary Urologist: Dr. Carolan Clines  HPI/Brief narrative 80 year old male patient with history of essential hypertension, recently seen in initial visit by urology for? Prostate cancer, presented to the ED with difficulty urinating and ankle swelling. In the ED, noted to be in urinary retention and past 2.5 L of dark brown urine following Foley placement and had abnormal labs: Creatinine 13.41 and potassium 6.5. Apparently had some diarrhea.   Assessment/Plan:  1. Acute urinary retention with bilateral hydronephrosis: Likely secondary to urinary bladder base mass. Foley placed. Urology consulted. 2. Mass at urinary bladder base: DD-prostate cancer versus bladder cancer. Urology consulted and will need further workup i.e. cystoscopy and biopsy. 3. Acute renal failure/hyperkalemia/anion gap metabolic acidosis: Secondary to obstructive uropathy from problem #1 and 2. Improving after Foley catheter placement. Metabolic acidosis and hyperkalemia resolved. Follow BMP closely. 4. Urinary tract infection: Continue IV Rocephin pending culture results. 5. Anemia: Mild acute on chronic. Follow CBC and transfuse if hemoglobin less than 7 g per DL. 6. Essential hypertension: Controlled   Code Status: Full Family Communication: None at bedside Disposition Plan: Home in medically stable   Consultants:  Urology  Procedures:  Foley catheter  Antibiotics:  Rocephin IV  Subjective: Feels sleepy. Otherwise states that he feels much better and denies any specific complaints. As per nursing, some bleeding underneath the scrotum from scratching.  Objective: Filed Vitals:   06/13/14 2345 06/14/14 0000 06/14/14 0045 06/14/14 0518  BP: 108/45 113/59 111/50 125/60  Pulse: 93 98 89 94  Temp:   97.4 F (36.3 C) 98.5 F (36.9 C)  TempSrc:   Oral Oral  Resp: 14 16 16 16   Height:    5\' 11"  (1.803 m)   Weight:   92.4 kg (203 lb 11.3 oz)   SpO2: 100% 100% 98% 100%    Intake/Output Summary (Last 24 hours) at 06/14/14 1440 Last data filed at 06/14/14 0815  Gross per 24 hour  Intake    120 ml  Output   6130 ml  Net  -6010 ml   Filed Weights   06/13/14 2045 06/14/14 0045  Weight: 78.472 kg (173 lb) 92.4 kg (203 lb 11.3 oz)     Exam:  General exam: Pleasant elderly male lying comfortably in bed. Respiratory system: Clear. No increased work of breathing. Cardiovascular system: S1 & S2 heard, RRR. No JVD, murmurs, gallops, clicks or pedal edema. Telemetry: Sinus rhythm. Gastrointestinal system: Abdomen is nondistended, soft and nontender. Normal bowel sounds heard. Mild old blood under scrotum but no active bleeding-likely from scratching and excoriation. Central nervous system: Alert and oriented. No focal neurological deficits. Extremities: Symmetric 5 x 5 power.   Data Reviewed: Basic Metabolic Panel:  Recent Labs Lab 06/13/14 2121 06/14/14 0336 06/14/14 0909 06/14/14 1229  NA 131* 136 139 139  K 6.5* 5.0 4.6 4.5  CL 96 103 109 109  CO2 15* 17* 18* 21  GLUCOSE 81 74 100* 122*  BUN 145* 121* 98* 88*  CREATININE 13.41* 9.13* 6.71* 5.52*  CALCIUM 8.5 8.1* 8.3* 8.3*  MG  --   --  2.5  --   PHOS  --   --  4.3  --    Liver Function Tests:  Recent Labs Lab 06/13/14 2121 06/14/14 0909  AST 16 13  ALT 18 17  ALKPHOS 88 74  BILITOT 1.6* 1.5*  PROT 6.4 5.7*  ALBUMIN 2.7* 2.3*  No results for input(s): LIPASE, AMYLASE in the last 168 hours. No results for input(s): AMMONIA in the last 168 hours. CBC:  Recent Labs Lab 06/13/14 2121 06/14/14 0909  WBC 15.0* 10.1  NEUTROABS 12.8*  --   HGB 8.9* 8.0*  HCT 26.7* 23.5*  MCV 87.8 89.0  PLT 345 290   Cardiac Enzymes: No results for input(s): CKTOTAL, CKMB, CKMBINDEX, TROPONINI in the last 168 hours. BNP (last 3 results) No results for input(s): PROBNP in the last 8760 hours. CBG: No  results for input(s): GLUCAP in the last 168 hours.  No results found for this or any previous visit (from the past 240 hour(s)).       Studies: US Renal  06/14/2014   CLINICAL DATA:  Acute renal failure.  Hypertension.  EXAM: RENAL/URINARY TRACT ULTRASOUND COMPLETE  COMPARISON:  None  FINDINGS: Right Kidney:  Length: 14.1 cm cysts are identified measuring 0.9 cm and 2.3 cm in the upper pole region. Echogenicity is normal. Moderate hydronephrosis is present.  Left Kidney:  Length: 13.4 cm. Echogenicity is normal. No solid or cystic mass. Moderate hydronephrosis is present.  Bladder:  Bladder wall thickened. There is an irregular solid and vascular mass within the base the bladder measuring 6.0 x 6.8 x 7 2 cm. The prostate gland appears enlarged measuring 5.2 x 4.4 x 5.8 cm. Is difficult to separate the mass at the basal bladder from the prostate gland.  IMPRESSION: 1. Bilateral hydronephrosis. 2. Mass at the base the bladder may be the cause hydronephrosis and is suspicious for malignancy. Considerations include bladder or prostate primary. Blood clot is felt to be unlikely given the presence of blood flow within the mass. Further evaluation with contrast-enhanced CT of the abdomen and pelvis is recommended. If the patient is not a candidate for contrast, noncontrast CT of the abdomen and pelvis would be suggested. 3. Small right renal cysts, benign in their appearance. 4. These results will be called to the ordering clinician or representative by the Radiologist Assistant, and communication documented in the PACS or zVision Dashboard.   Electronically Signed   By: Shon Hale M.D.   On: 06/14/2014 08:20   Dg Abd Acute W/chest  06/13/2014   CLINICAL DATA:  Difficulty urinating for 2 days. Diarrhea for 3 days. Low abdominal pain.  EXAM: ACUTE ABDOMEN SERIES (ABDOMEN 2 VIEW & CHEST 1 VIEW)  COMPARISON:  Chest x-ray 10/18/2008  FINDINGS: There is hyperinflation of the lungs compatible with COPD. Chronic  changes/ scarring in the lungs. Heart is normal size. No acute airspace opacities or effusions.  Nonobstructive bowel gas pattern. No free air. No organomegaly or suspicious calcification.  No acute bony abnormality. Degenerative changes throughout the thoracolumbar spine.  IMPRESSION: No evidence of bowel obstruction or free air.  COPD/chronic changes in the lungs.  No active disease.   Electronically Signed   By: Rolm Baptise M.D.   On: 06/13/2014 22:45        Scheduled Meds: . sodium chloride   Intravenous STAT  . cefTRIAXone (ROCEPHIN)  IV  1 g Intravenous Q24H  . docusate sodium  100 mg Oral BID  . sodium chloride  3 mL Intravenous Q12H   Continuous Infusions:   Active Problems:   Urinary obstruction   Acute renal failure   Hyperkalemia   UTI (lower urinary tract infection)    Time spent: 50 minutes.    Vernell Leep, MD, FACP, FHM. [facility] Pager [phone]  If 7PM-7AM, please contact night-coverage [email or web address] Password  [email or web address] 06/14/2014, 2:40 PM    LOS: 1 day

## 2014-06-14 NOTE — Evaluation (Signed)
Physical Therapy Evaluation Patient Details Name: [name] Ferrell MRN: [id number] DOB: [birth year] Today's Date: 06/14/2014   History of Present Illness  80 y.o. male admitted with Acute renal failure/hyperkalemia/anion gap metabolic acidosis: Secondary to obstructive uropathy. Found to have Mass at urinary bladder base. Hx of prostate cancer.  Clinical Impression  Pt admitted with the above complications. Pt currently with functional limitations due to the deficits listed below (see PT Problem List). Quite unsteady on feet without an assistive device, requiring min assist for balance however improves greatly with a rolling walker for support. Pt reports he cares for his wife at home who is physically disabled. Do not believe it would be safe for neither pt nor wife if he had to care for her in this current condition. States he has arranged for 24 hour care at home. With appropriate support I feel pt could manage at home and receive HHPT to further progress his independence. Pt will benefit from skilled PT to increase their independence and safety with mobility to allow discharge to the venue listed below.       Follow Up Recommendations Home health PT;Supervision/Assistance - 24 hour    Equipment Recommendations  None recommended by PT    Recommendations for Other Services OT consult     Precautions / Restrictions Precautions Precautions: Fall Restrictions Weight Bearing Restrictions: No      Mobility  Bed Mobility Overal bed mobility: Modified Independent                Transfers Overall transfer level: Needs assistance Equipment used: None Transfers: Sit to/from Stand Sit to Stand: Min guard         General transfer comment: Close guard for safety. Pt reaching for furniture due to difficulty with balance.  Ambulation/Gait Ambulation/Gait assistance: Min assist Ambulation Distance (Feet): 300 Feet Assistive device: Rolling walker (2 wheeled);1 person hand held  assist Gait Pattern/deviations: Step-through pattern;Decreased stride length;Staggering left;Staggering right;Trunk flexed Gait velocity: decreased   General Gait Details: Pt very unsteady without an assistive device, staggering to Lt and Rt, required min assist with hand held support for balance. Improved greatly with use of a rolling walker for support. Min guard for safety, VC for walker placement for proximity and upright posture.  Stairs            Wheelchair Mobility    Modified Rankin (Stroke Patients Only)       Balance Overall balance assessment: Needs assistance Sitting-balance support: No upper extremity supported;Feet supported Sitting balance-Leahy Scale: Good     Standing balance support: Single extremity supported Standing balance-Leahy Scale: Poor                               Pertinent Vitals/Pain Pain Assessment: No/denies pain    Home Living Family/patient expects to be discharged to:: Private residence Living Arrangements: Spouse/significant other Available Help at Discharge: Family;Available 24 hours/day;Neighbor (states son and daughter are coming to stay with pt.) Type of Home: House Home Access: Stairs to enter Entrance Stairs-Rails: None Entrance Stairs-Number of Steps: 1 Home Layout: One level Home Equipment: Walker - 2 wheels;Bedside commode;Shower seat;Walker - 4 wheels Additional Comments: Patient physically cares for wife who is disabled, at baseline.    Prior Function Level of Independence: Independent               Hand Dominance   Dominant Hand: Right    Extremity/Trunk Assessment   Upper Extremity Assessment:  Defer to OT evaluation           Lower Extremity Assessment: Generalized weakness         Communication   Communication: No difficulties  Cognition Arousal/Alertness: Awake/alert Behavior During Therapy: WFL for tasks assessed/performed Overall Cognitive Status: Within Functional Limits for  tasks assessed                      General Comments General comments (skin integrity, edema, etc.): dark red urine in foley.    Exercises        Assessment/Plan    PT Assessment Patient needs continued PT services  PT Diagnosis Difficulty walking;Abnormality of gait;Generalized weakness   PT Problem List Decreased strength;Decreased activity tolerance;Decreased balance;Decreased mobility;Decreased knowledge of use of DME;Decreased knowledge of precautions  PT Treatment Interventions DME instruction;Gait training;Stair training;Functional mobility training;Therapeutic activities;Therapeutic exercise;Balance training;Neuromuscular re-education;Patient/family education   PT Goals (Current goals can be found in the Care Plan section) Acute Rehab PT Goals Patient Stated Goal: Go home PT Goal Formulation: With patient Time For Goal Achievement: 06/28/14 Potential to Achieve Goals: Good    Frequency Min 3X/week   Barriers to discharge Decreased caregiver support Pt tells PT that he has 24 hour care at home. Reported to RN this AM that he does not have 24 hour care at home.    Co-evaluation               End of Session Equipment Utilized During Treatment: Gait belt Activity Tolerance: Patient tolerated treatment well Patient left: in bed;with call bell/phone within reach Nurse Communication: Mobility status;Precautions         Time: 8937-3428 PT Time Calculation (min) (ACUTE ONLY): 25 min   Charges:   PT Evaluation $Initial PT Evaluation Tier I: 1 Procedure PT Treatments $Gait Training: 8-22 mins   PT G CodesEllouise Newer 06/14/2014, 4:11 PM Camille Bal [place], [place]

## 2014-06-14 NOTE — Consult Note (Signed)
Reason for Consult: Bilateral Hydronephrosis, Elevated PSA, Bladder Neck Mass, Acute Renal Failure  Referring Physician: A. Hongalgi MD  [name] Ferrell is an 80 y.o. male.   HPI:   1 - Bilateral Hydronephrosis with Acute Renal Failure - Bilateral mod hydro by renal US on eval acute renal failure with Cr >6 05/2014, up from prior baseline <1.0 2010. UNa >20.   2 - Bladder Neck Mass - bladder neck mass noted by renal US 05/2014, likely contiguous with prostate. No axial imaging thus far.  3 -  Prostate Cancer - pt DX with prostate cancer in 1990 by [facility] and declined reccommended therapy, has been having PSA's annually by PCP but now with recent rise to >15 this year 2015.   4 - Urinary Retention - new urinary retention prompting current admission, foley now in place with large UOP.  Today " [name] Ferrell " is seen in consultation for above. He has recently established with Dr. Gaynelle Arabian in our office.  Past Medical History  Diagnosis Date  . Cancer   . Hypertension     History reviewed. No pertinent past surgical history.  Family History  Problem Relation Age of Onset  . Lymphoma Mother   . Lung cancer Father     Social History:  reports that he has quit smoking. He does not have any smokeless tobacco history on file. He reports that he drinks alcohol. He reports that he does not use illicit drugs.  Allergies:  Allergies  Allergen Reactions  . Penicillins Hives    On bottom of feet    Medications: I have reviewed the patient's current medications.  Results for orders placed or performed during the hospital encounter of 06/13/14 (from the past 48 hour(s))  CBC with Differential     Status: Abnormal   Collection Time: 06/13/14  9:21 PM  Result Value Ref Range   WBC 15.0 (H) 4.0 - 10.5 K/uL   RBC 3.04 (L) 4.22 - 5.81 MIL/uL   Hemoglobin 8.9 (L) 13.0 - 17.0 g/dL   HCT 26.7 (L) 39.0 - 52.0 %   MCV 87.8 78.0 - 100.0 fL   MCH 29.3 26.0 - 34.0 pg   MCHC 33.3 30.0 - 36.0  g/dL   RDW 14.6 11.5 - 15.5 %   Platelets 345 150 - 400 K/uL   Neutrophils Relative % 85 (H) 43 - 77 %   Neutro Abs 12.8 (H) 1.7 - 7.7 K/uL   Lymphocytes Relative 7 (L) 12 - 46 %   Lymphs Abs 1.0 0.7 - 4.0 K/uL   Monocytes Relative 7 3 - 12 %   Monocytes Absolute 1.1 (H) 0.1 - 1.0 K/uL   Eosinophils Relative 1 0 - 5 %   Eosinophils Absolute 0.1 0.0 - 0.7 K/uL   Basophils Relative 0 0 - 1 %   Basophils Absolute 0.0 0.0 - 0.1 K/uL  Comprehensive metabolic panel     Status: Abnormal   Collection Time: 06/13/14  9:21 PM  Result Value Ref Range   Sodium 131 (L) 135 - 145 mmol/L    Comment: Please note change in reference range.   Potassium 6.5 (HH) 3.5 - 5.1 mmol/L    Comment: Please note change in reference range. REPEATED TO VERIFY NO VISIBLE HEMOLYSIS CRITICAL RESULT CALLED TO, READ BACK BY AND VERIFIED WITH: CARLAN C,RN 06/13/14 2205 WAYK    Chloride 96 96 - 112 mEq/L   CO2 15 (L) 19 - 32 mmol/L   Glucose, Bld 81 70 - 99  mg/dL   BUN 145 (H) 6 - 23 mg/dL   Creatinine, Ser 13.41 (H) 0.50 - 1.35 mg/dL   Calcium 8.5 8.4 - 10.5 mg/dL   Total Protein 6.4 6.0 - 8.3 g/dL   Albumin 2.7 (L) 3.5 - 5.2 g/dL   AST 16 0 - 37 U/L   ALT 18 0 - 53 U/L   Alkaline Phosphatase 88 39 - 117 U/L   Total Bilirubin 1.6 (H) 0.3 - 1.2 mg/dL   GFR calc non Af Amer 3 (L) >90 mL/min   GFR calc Af Amer 3 (L) >90 mL/min    Comment: (NOTE) The eGFR has been calculated using the CKD EPI equation. This calculation has not been validated in all clinical situations. eGFR's persistently <90 mL/min signify possible Chronic Kidney Disease.    Anion gap 20 (H) 5 - 15  Urinalysis, Routine w reflex microscopic     Status: Abnormal   Collection Time: 06/13/14  9:49 PM  Result Value Ref Range   Color, Urine BROWN (A) YELLOW    Comment: BIOCHEMICALS MAY BE AFFECTED BY COLOR   APPearance TURBID (A) CLEAR   Specific Gravity, Urine 1.016 1.005 - 1.030   pH 5.5 5.0 - 8.0   Glucose, UA NEGATIVE NEGATIVE mg/dL    Hgb urine dipstick LARGE (A) NEGATIVE   Bilirubin Urine LARGE (A) NEGATIVE   Ketones, ur 15 (A) NEGATIVE mg/dL   Protein, ur 100 (A) NEGATIVE mg/dL   Urobilinogen, UA 0.2 0.0 - 1.0 mg/dL   Nitrite POSITIVE (A) NEGATIVE   Leukocytes, UA LARGE (A) NEGATIVE  Urine microscopic-add on     Status: Abnormal   Collection Time: 06/13/14  9:49 PM  Result Value Ref Range   Squamous Epithelial / LPF RARE RARE   WBC, UA TOO NUMEROUS TO COUNT <3 WBC/hpf   RBC / HPF TOO NUMEROUS TO COUNT <3 RBC/hpf   Bacteria, UA MANY (A) RARE  Basic metabolic panel     Status: Abnormal   Collection Time: 06/14/14  3:36 AM  Result Value Ref Range   Sodium 136 135 - 145 mmol/L    Comment: Please note change in reference range.   Potassium 5.0 3.5 - 5.1 mmol/L    Comment: Please note change in reference range. DELTA CHECK NOTED    Chloride 103 96 - 112 mEq/L   CO2 17 (L) 19 - 32 mmol/L   Glucose, Bld 74 70 - 99 mg/dL   BUN 121 (H) 6 - 23 mg/dL   Creatinine, Ser 9.13 (H) 0.50 - 1.35 mg/dL   Calcium 8.1 (L) 8.4 - 10.5 mg/dL   GFR calc non Af Amer 5 (L) >90 mL/min   GFR calc Af Amer 5 (L) >90 mL/min    Comment: (NOTE) The eGFR has been calculated using the CKD EPI equation. This calculation has not been validated in all clinical situations. eGFR's persistently <90 mL/min signify possible Chronic Kidney Disease.    Anion gap 16 (H) 5 - 15  TSH     Status: None   Collection Time: 06/14/14  3:36 AM  Result Value Ref Range   TSH 3.120 0.350 - 4.500 uIU/mL  Creatinine, urine, random     Status: None   Collection Time: 06/14/14  5:27 AM  Result Value Ref Range   Creatinine, Urine 108.48 mg/dL  Sodium, urine, random     Status: None   Collection Time: 06/14/14  5:27 AM  Result Value Ref Range   Sodium, Ur 59 mmol/L  Magnesium     Status: None   Collection Time: 06/14/14  9:09 AM  Result Value Ref Range   Magnesium 2.5 1.5 - 2.5 mg/dL  Phosphorus     Status: None   Collection Time: 06/14/14  9:09 AM   Result Value Ref Range   Phosphorus 4.3 2.3 - 4.6 mg/dL  Comprehensive metabolic panel     Status: Abnormal   Collection Time: 06/14/14  9:09 AM  Result Value Ref Range   Sodium 139 135 - 145 mmol/L    Comment: Please note change in reference range.   Potassium 4.6 3.5 - 5.1 mmol/L    Comment: Please note change in reference range.   Chloride 109 96 - 112 mEq/L   CO2 18 (L) 19 - 32 mmol/L   Glucose, Bld 100 (H) 70 - 99 mg/dL   BUN 98 (H) 6 - 23 mg/dL   Creatinine, Ser 6.71 (H) 0.50 - 1.35 mg/dL   Calcium 8.3 (L) 8.4 - 10.5 mg/dL   Total Protein 5.7 (L) 6.0 - 8.3 g/dL   Albumin 2.3 (L) 3.5 - 5.2 g/dL   AST 13 0 - 37 U/L   ALT 17 0 - 53 U/L   Alkaline Phosphatase 74 39 - 117 U/L   Total Bilirubin 1.5 (H) 0.3 - 1.2 mg/dL   GFR calc non Af Amer 7 (L) >90 mL/min   GFR calc Af Amer 8 (L) >90 mL/min    Comment: (NOTE) The eGFR has been calculated using the CKD EPI equation. This calculation has not been validated in all clinical situations. eGFR's persistently <90 mL/min signify possible Chronic Kidney Disease.    Anion gap 12 5 - 15  CBC     Status: Abnormal   Collection Time: 06/14/14  9:09 AM  Result Value Ref Range   WBC 10.1 4.0 - 10.5 K/uL   RBC 2.64 (L) 4.22 - 5.81 MIL/uL   Hemoglobin 8.0 (L) 13.0 - 17.0 g/dL   HCT 23.5 (L) 39.0 - 52.0 %   MCV 89.0 78.0 - 100.0 fL   MCH 30.3 26.0 - 34.0 pg   MCHC 34.0 30.0 - 36.0 g/dL   RDW 14.6 11.5 - 15.5 %   Platelets 290 150 - 400 K/uL  Basic metabolic panel     Status: Abnormal   Collection Time: 06/14/14 12:29 PM  Result Value Ref Range   Sodium 139 135 - 145 mmol/L    Comment: Please note change in reference range.   Potassium 4.5 3.5 - 5.1 mmol/L    Comment: Please note change in reference range.   Chloride 109 96 - 112 mEq/L   CO2 21 19 - 32 mmol/L   Glucose, Bld 122 (H) 70 - 99 mg/dL   BUN 88 (H) 6 - 23 mg/dL   Creatinine, Ser 5.52 (H) 0.50 - 1.35 mg/dL   Calcium 8.3 (L) 8.4 - 10.5 mg/dL   GFR calc non Af Amer 8 (L)  >90 mL/min   GFR calc Af Amer 10 (L) >90 mL/min    Comment: (NOTE) The eGFR has been calculated using the CKD EPI equation. This calculation has not been validated in all clinical situations. eGFR's persistently <90 mL/min signify possible Chronic Kidney Disease.    Anion gap 9 5 - 15  Basic metabolic panel     Status: Abnormal   Collection Time: 06/14/14  4:50 PM  Result Value Ref Range   Sodium 137 135 - 145 mmol/L    Comment: Please note  change in reference range.   Potassium 4.9 3.5 - 5.1 mmol/L    Comment: Please note change in reference range.   Chloride 107 96 - 112 mEq/L   CO2 20 19 - 32 mmol/L   Glucose, Bld 136 (H) 70 - 99 mg/dL   BUN 84 (H) 6 - 23 mg/dL   Creatinine, Ser 3.96 (H) 0.50 - 1.35 mg/dL   Calcium 8.2 (L) 8.4 - 10.5 mg/dL   GFR calc non Af Amer 9 (L) >90 mL/min   GFR calc Af Amer 11 (L) >90 mL/min    Comment: (NOTE) The eGFR has been calculated using the CKD EPI equation. This calculation has not been validated in all clinical situations. eGFR's persistently <90 mL/min signify possible Chronic Kidney Disease.    Anion gap 10 5 - 15    US Renal  06/14/2014   CLINICAL DATA:  Acute renal failure.  Hypertension.  EXAM: RENAL/URINARY TRACT ULTRASOUND COMPLETE  COMPARISON:  None  FINDINGS: Right Kidney:  Length: 14.1 cm cysts are identified measuring 0.9 cm and 2.3 cm in the upper pole region. Echogenicity is normal. Moderate hydronephrosis is present.  Left Kidney:  Length: 13.4 cm. Echogenicity is normal. No solid or cystic mass. Moderate hydronephrosis is present.  Bladder:  Bladder wall thickened. There is an irregular solid and vascular mass within the base the bladder measuring 6.0 x 6.8 x 7 2 cm. The prostate gland appears enlarged measuring 5.2 x 4.4 x 5.8 cm. Is difficult to separate the mass at the basal bladder from the prostate gland.  IMPRESSION: 1. Bilateral hydronephrosis. 2. Mass at the base the bladder may be the cause hydronephrosis and is  suspicious for malignancy. Considerations include bladder or prostate primary. Blood clot is felt to be unlikely given the presence of blood flow within the mass. Further evaluation with contrast-enhanced CT of the abdomen and pelvis is recommended. If the patient is not a candidate for contrast, noncontrast CT of the abdomen and pelvis would be suggested. 3. Small right renal cysts, benign in their appearance. 4. These results will be called to the ordering clinician or representative by the Radiologist Assistant, and communication documented in the PACS or zVision Dashboard.   Electronically Signed   By: Rosalie Gums M.D.   On: 06/14/2014 08:20   Dg Abd Acute W/chest  06/13/2014   CLINICAL DATA:  Difficulty urinating for 2 days. Diarrhea for 3 days. Low abdominal pain.  EXAM: ACUTE ABDOMEN SERIES (ABDOMEN 2 VIEW & CHEST 1 VIEW)  COMPARISON:  Chest x-ray 10/18/2008  FINDINGS: There is hyperinflation of the lungs compatible with COPD. Chronic changes/ scarring in the lungs. Heart is normal size. No acute airspace opacities or effusions.  Nonobstructive bowel gas pattern. No free air. No organomegaly or suspicious calcification.  No acute bony abnormality. Degenerative changes throughout the thoracolumbar spine.  IMPRESSION: No evidence of bowel obstruction or free air.  COPD/chronic changes in the lungs.  No active disease.   Electronically Signed   By: Charlett Nose M.D.   On: 06/13/2014 22:45    Review of Systems  Constitutional: Positive for malaise/fatigue.  HENT: Negative.   Eyes: Negative.   Respiratory: Negative.   Cardiovascular: Negative.   Gastrointestinal: Negative.   Genitourinary: Positive for urgency and hematuria.  Musculoskeletal: Negative.   Skin: Negative.   Neurological: Negative.   Endo/Heme/Allergies: Negative.   Psychiatric/Behavioral: Negative.    Blood pressure 108/47, pulse 90, temperature 98.1 F (36.7 C), temperature source Oral, resp. rate 16,  height '5\' 11"'$  (1.803 m),  weight 92.4 kg (203 lb 11.3 oz), SpO2 100 %. Physical Exam  Constitutional: He appears well-developed and well-nourished.  Family friend at bedside  HENT:  Head: Normocephalic.  Eyes: Pupils are equal, round, and reactive to light.  Neck: Normal range of motion.  Cardiovascular: Normal rate.   Respiratory: Effort normal.  GI: Soft.  Genitourinary: Penis normal.  Foley c/d/i with red urine, no clots  Musculoskeletal: Normal range of motion.  Neurological: He is alert.  Skin: Skin is warm.  Psychiatric: He has a normal mood and affect. His behavior is normal. Judgment and thought content normal.    Assessment/Plan:  1 - Bilateral Hydronephrosis with Acute Renal Failure - likely multifactorial with some degree of outlet obstruction and possiblye even malignant obstruction from bladder neck-prostate mass. CT abd-pelvis w/o contrast tonight. May need bilat neph tubes if appears bladder neck / trigone area obliterated v. Possible trial of stents if not.   2 - Bladder Neck Mass - unclear DX. DDX includes bladder cancer, benign or malignant prostate source. Will need cysto and likely transurethral resection and possible prostate biopsy soon. Imaging as per above prior.   3 -  Prostate Cancer - CT tonight to help r/o metastatic disease.   4 - Urinary Retention - continue current foley.  5 - I will make Dr. Gaynelle Arabian aware of the pt's admission tomorrow.   Kalief Kattner 06/14/2014, 5:47 PM

## 2014-06-14 NOTE — ED Provider Notes (Signed)
CSN: [id number]     Arrival date & time 06/13/14  1959 History   First MD Initiated Contact with Patient 06/13/14 2123     Chief Complaint  Patient presents with  . Urinary Retention     (Consider location/radiation/quality/duration/timing/severity/associated sxs/prior Treatment) The history is provided by the patient.   patient presents for difficulty urinating. States he last urinated over a day ago. States he has been having some difficulty urinating and has prostate cancer. No fevers. Also has had some mild diarrhea. He states that he thinks his cancer is worse because he took probiotics. No fevers. He states on the pain has gotten slightly. No vomiting. He states he feels weak all over.  Past Medical History  Diagnosis Date  . Cancer   . Hypertension    History reviewed. No pertinent past surgical history. Family History  Problem Relation Age of Onset  . Lymphoma Mother   . Lung cancer Father    History  Substance Use Topics  . Smoking status: Former Research scientist (life sciences)  . Smokeless tobacco: Not on file  . Alcohol Use: Yes     Comment: 1 drink a day not for ast week    Review of Systems  Constitutional: Positive for fatigue. Negative for activity change and appetite change.  Eyes: Negative for pain.  Respiratory: Negative for chest tightness and shortness of breath.   Cardiovascular: Negative for chest pain and leg swelling.  Gastrointestinal: Negative for nausea, vomiting, abdominal pain and diarrhea.  Genitourinary: Positive for enuresis and difficulty urinating. Negative for flank pain.  Musculoskeletal: Negative for back pain and neck stiffness.  Skin: Negative for rash.  Neurological: Negative for weakness, numbness and headaches.  Psychiatric/Behavioral: Negative for behavioral problems.      Allergies  Penicillins  Home Medications   Prior to Admission medications   Medication Sig Start Date End Date Taking? Authorizing Provider  aspirin 81 MG tablet Take 81 mg  by mouth daily.   Yes Historical Provider, MD  Cholecalciferol (VITAMIN D3) 2000 UNITS TABS Take 2,000 Units by mouth daily.   Yes Historical Provider, MD  lisinopril (PRINIVIL,ZESTRIL) 10 MG tablet Take 10 mg by mouth daily.   Yes Historical Provider, MD  naproxen sodium (ANAPROX) 550 MG tablet Take 550 mg by mouth 2 (two) times daily with a meal.   Yes Historical Provider, MD  OVER THE COUNTER MEDICATION Take 1 tablet by mouth daily. ubiqunol   Yes Historical Provider, MD   BP 111/50 mmHg  Pulse 89  Temp(Src) 97.4 F (36.3 C) (Oral)  Resp 16  Ht 5\' 11"  (1.803 m)  Wt 203 lb 11.3 oz (92.4 kg)  BMI 28.42 kg/m2  SpO2 98% Physical Exam  Constitutional: He appears well-developed and well-nourished.  HENT:  Head: Normocephalic.  Eyes: Pupils are equal, round, and reactive to light.  Cardiovascular: Normal rate and regular rhythm.   Pulmonary/Chest: Effort normal.  Abdominal: He exhibits mass.   Diffuse tenderness with lower abdominal fullness. Somewhat hyperactive bowel sounds.  Musculoskeletal: Normal range of motion.  Neurological: He is alert.  Skin: Skin is warm.    ED Course  Procedures (including critical care time) Labs Review Labs Reviewed  CBC WITH DIFFERENTIAL - Abnormal; Notable for the following:    WBC 15.0 (*)    RBC 3.04 (*)    Hemoglobin 8.9 (*)    HCT 26.7 (*)    Neutrophils Relative % 85 (*)    Neutro Abs 12.8 (*)    Lymphocytes Relative 7 (*)  Monocytes Absolute 1.1 (*)    All other components within normal limits  COMPREHENSIVE METABOLIC PANEL - Abnormal; Notable for the following:    Sodium 131 (*)    Potassium 6.5 (*)    CO2 15 (*)    BUN 145 (*)    Creatinine, Ser 13.41 (*)    Albumin 2.7 (*)    Total Bilirubin 1.6 (*)    GFR calc non Af Amer 3 (*)    GFR calc Af Amer 3 (*)    Anion gap 20 (*)    All other components within normal limits  URINALYSIS, ROUTINE W REFLEX MICROSCOPIC - Abnormal; Notable for the following:    Color, Urine BROWN  (*)    APPearance TURBID (*)    Hgb urine dipstick LARGE (*)    Bilirubin Urine LARGE (*)    Ketones, ur 15 (*)    Protein, ur 100 (*)    Nitrite POSITIVE (*)    Leukocytes, UA LARGE (*)    All other components within normal limits  URINE MICROSCOPIC-ADD ON - Abnormal; Notable for the following:    Bacteria, UA MANY (*)    All other components within normal limits  URINE CULTURE    Imaging Review Dg Abd Acute W/chest  06/13/2014   CLINICAL DATA:  Difficulty urinating for 2 days. Diarrhea for 3 days. Low abdominal pain.  EXAM: ACUTE ABDOMEN SERIES (ABDOMEN 2 VIEW & CHEST 1 VIEW)  COMPARISON:  Chest x-ray 10/18/2008  FINDINGS: There is hyperinflation of the lungs compatible with COPD. Chronic changes/ scarring in the lungs. Heart is normal size. No acute airspace opacities or effusions.  Nonobstructive bowel gas pattern. No free air. No organomegaly or suspicious calcification.  No acute bony abnormality. Degenerative changes throughout the thoracolumbar spine.  IMPRESSION: No evidence of bowel obstruction or free air.  COPD/chronic changes in the lungs.  No active disease.   Electronically Signed   By: Rolm Baptise M.D.   On: 06/13/2014 22:45     EKG Interpretation   Date/Time:  Sunday June 13 2014 21:21:54 EST Ventricular Rate:  88 PR Interval:  170 QRS Duration: 90 QT Interval:  364 QTC Calculation: 440 R Axis:   55 Text Interpretation:  Normal sinus rhythm Normal ECG Confirmed by  Alvino Chapel  MD, Ovid Curd [phone]) on 06/13/2014 9:32:46 PM      MDM   Final diagnoses:  Diarrhea  Acute renal failure, unspecified acute renal failure type  Urinary obstruction  Hyperkalemia  UTI (lower urinary tract infection)     patient with a renal failure after urinary obstruction. Of looking urine came out after Foley catheter placement. Has UTI and creatinine of 13. Potassium is elevated at 6.5 with slightly pink T waves. Will admit to internal medicine. Hyperkalemia was  treated.  CRITICAL CARE Performed by: Mackie Pai Total critical care time: 30 Critical care time was exclusive of separately billable procedures and treating other patients. Critical care was necessary to treat or prevent imminent or life-threatening deterioration. Critical care was time spent personally by me on the following activities: development of treatment plan with patient and/or surrogate as well as nursing, discussions with consultants, evaluation of patient's response to treatment, examination of patient, obtaining history from patient or surrogate, ordering and performing treatments and interventions, ordering and review of laboratory studies, ordering and review of radiographic studies, pulse oximetry and re-evaluation of patient's condition.     Jasper Riling. Alvino Chapel, MD 06/14/14 [phone]

## 2014-06-15 ENCOUNTER — Inpatient Hospital Stay (HOSPITAL_COMMUNITY): Payer: Commercial Managed Care - HMO

## 2014-06-15 DIAGNOSIS — N3289 Other specified disorders of bladder: Secondary | ICD-10-CM

## 2014-06-15 LAB — GLUCOSE, CAPILLARY
GLUCOSE-CAPILLARY: 100 mg/dL — AB (ref 70–99)
Glucose-Capillary: 99 mg/dL (ref 70–99)
Glucose-Capillary: 95 mg/dL (ref 70–99)

## 2014-06-15 LAB — CBC
HCT: 22 % — ABNORMAL LOW (ref 39.0–52.0)
HEMATOCRIT: 20.9 % — AB (ref 39.0–52.0)
HEMOGLOBIN: 7.2 g/dL — AB (ref 13.0–17.0)
Hemoglobin: 6.9 g/dL — CL (ref 13.0–17.0)
MCH: 29.1 pg (ref 26.0–34.0)
MCH: 29.6 pg (ref 26.0–34.0)
MCHC: 32.7 g/dL (ref 30.0–36.0)
MCHC: 33 g/dL (ref 30.0–36.0)
MCV: 89.1 fL (ref 78.0–100.0)
MCV: 89.7 fL (ref 78.0–100.0)
Platelets: 262 10*3/uL (ref 150–400)
Platelets: 241 10*3/uL (ref 150–400)
RBC: 2.47 MIL/uL — ABNORMAL LOW (ref 4.22–5.81)
RBC: 2.33 MIL/uL — ABNORMAL LOW (ref 4.22–5.81)
RDW: 14.6 % (ref 11.5–15.5)
RDW: 14.5 % (ref 11.5–15.5)
WBC: 11.2 10*3/uL — ABNORMAL HIGH (ref 4.0–10.5)
WBC: 12.2 10*3/uL — AB (ref 4.0–10.5)

## 2014-06-15 LAB — URINE CULTURE
COLONY COUNT: NO GROWTH
CULTURE: NO GROWTH

## 2014-06-15 LAB — BASIC METABOLIC PANEL
Anion gap: 10 (ref 5–15)
Anion gap: 9 (ref 5–15)
BUN: 89 mg/dL — AB (ref 6–23)
BUN: 72 mg/dL — AB (ref 6–23)
CHLORIDE: 109 meq/L (ref 96–112)
CHLORIDE: 106 meq/L (ref 96–112)
CO2: 17 mmol/L — ABNORMAL LOW (ref 19–32)
CO2: 21 mmol/L (ref 19–32)
Calcium: 8.1 mg/dL — ABNORMAL LOW (ref 8.4–10.5)
Calcium: 8 mg/dL — ABNORMAL LOW (ref 8.4–10.5)
Creatinine, Ser: 5.63 mg/dL — ABNORMAL HIGH (ref 0.50–1.35)
Creatinine, Ser: 4.36 mg/dL — ABNORMAL HIGH (ref 0.50–1.35)
GFR calc Af Amer: 13 mL/min — ABNORMAL LOW (ref >90)
GFR calc non Af Amer: 8 mL/min — ABNORMAL LOW (ref >90)
GFR calc non Af Amer: 11 mL/min — ABNORMAL LOW (ref >90)
GFR, EST AFRICAN AMERICAN: 10 mL/min — AB (ref >90)
GLUCOSE: 104 mg/dL — AB (ref 70–99)
Glucose, Bld: 102 mg/dL — ABNORMAL HIGH (ref 70–99)
POTASSIUM: 4.9 mmol/L (ref 3.5–5.1)
POTASSIUM: 4.7 mmol/L (ref 3.5–5.1)
Sodium: 136 mmol/L (ref 135–145)
Sodium: 136 mmol/L (ref 135–145)

## 2014-06-15 LAB — PREPARE RBC (CROSSMATCH)

## 2014-06-15 MED ORDER — SODIUM CHLORIDE 0.9 % IV SOLN
INTRAVENOUS | Status: AC
  Administered 2014-06-16: 01:00:00 via INTRAVENOUS

## 2014-06-15 MED ORDER — IOHEXOL 300 MG/ML  SOLN
25.0000 mL | INTRAMUSCULAR | Status: AC
Start: 2014-06-15 — End: 2014-06-15
  Administered 2014-06-15 (×2): 25 mL via ORAL

## 2014-06-15 MED ORDER — SODIUM CHLORIDE 0.9 % IV SOLN: Freq: Once | INTRAVENOUS | Status: AC

## 2014-06-15 NOTE — Progress Notes (Signed)
PROGRESS NOTE    [name] Ferrell EPP:[id number] DOB: [birth year] DOA: 06/13/2014 PCP: [name], MD  Primary Urologist: Dr. Carolan Clines  HPI/Brief narrative 80 year old male patient with history of essential hypertension, recently seen in initial visit by urology for? Prostate cancer, presented to the ED with difficulty urinating and ankle swelling. In the ED, noted to be in urinary retention and past 2.5 L of dark brown urine following Foley placement and had abnormal labs: Creatinine 13.41 and potassium 6.5. Apparently had some diarrhea.   Assessment/Plan:  1. Acute urinary retention with bilateral hydronephrosis: Likely secondary to urinary bladder base mass. Foley placed. Urology consultation appreciated. Follow-up CT abdomen and pelvis without contrast. Management per urology. 2. Mass at urinary bladder base: DD-prostate cancer versus bladder cancer. Management per urology.  3. Acute renal failure/hyperkalemia/anion gap metabolic acidosis: Secondary to obstructive uropathy from problem #1 and 2. Improving after Foley catheter placement. Metabolic acidosis and hyperkalemia resolved. Creatinine was improving but has plateaued over the last 12 hours. Increase IV fluids and follow-up CT abdomen. Seems to be having good output through the Foley catheter. 4. Urinary tract infection: UA was impressive. Urine culture is negative but would continue IV Rocephin given Foley catheter and still possible UTI. 5. Anemia: Mild acute on chronic. Hemoglobin has dropped to 7.2 g per DL-some of this may be secondary to hematuria and dilutional. Recheck  this evening and may need transfusion if less than 7 g per DL. 6. Essential hypertension: Controlled/soft blood pressures. Not on medications at this time.   Code Status: Full Family Communication: None at bedside Disposition Plan: Home when medically stable   Consultants:  Urology  Procedures:  Foley  catheter  Antibiotics:  Rocephin IV  Subjective: Had some penile pain overnight which has improved this morning. Passing flatus. Last BM 3 days ago. Denies any other complaints.  Objective: Filed Vitals:   06/14/14 0518 06/14/14 1707 06/14/14 2158 06/15/14 0420  BP: 125/60 108/47 92/48 94/69   Pulse: 94 90 90 84  Temp: 98.5 F (36.9 C) 98.1 F (36.7 C) 98.4 F (36.9 C) 98.6 F (37 C)  TempSrc: Oral Oral Oral Oral  Resp: 16 16 16 18   Height:      Weight:      SpO2: 100% 100% 99% 100%    Intake/Output Summary (Last 24 hours) at 06/15/14 0850 Last data filed at 06/15/14 0641  Gross per 24 hour  Intake    200 ml  Output   2300 ml  Net  -2100 ml   Filed Weights   06/13/14 2045 06/14/14 0045  Weight: 78.472 kg (173 lb) 92.4 kg (203 lb 11.3 oz)     Exam:  General exam: Pleasant elderly male lying comfortably in bed. Respiratory system: Clear. No increased work of breathing. Cardiovascular system: S1 & S2 heard, RRR. No JVD, murmurs, gallops, clicks or pedal edema. Telemetry: Sinus rhythm. Gastrointestinal system: Abdomen is nondistended, soft and nontender. Normal bowel sounds heard. Mild old blood under scrotum but no active bleeding-likely from scratching and excoriation. Central nervous system: Alert and oriented. No focal neurological deficits. Extremities: Symmetric 5 x 5 power.   Data Reviewed: Basic Metabolic Panel:  Recent Labs Lab 06/14/14 0336 06/14/14 0909 06/14/14 1229 06/14/14 1650 06/15/14 0645  NA 136 139 139 137 136  K 5.0 4.6 4.5 4.9 4.9  CL 103 109 109 107 109  CO2 17* 18* 21 20 17*  GLUCOSE 74 100* 122* 136* 102*  BUN 121* 98* 88* 84* 89*  CREATININE 9.13* 6.71* 5.52* 5.19* 5.63*  CALCIUM 8.1* 8.3* 8.3* 8.2* 8.1*  MG  --  2.5  --   --   --   PHOS  --  4.3  --   --   --    Liver Function Tests:  Recent Labs Lab 06/13/14 2121 06/14/14 0909  AST 16 13  ALT 18 17  ALKPHOS 88 74  BILITOT 1.6* 1.5*  PROT 6.4 5.7*  ALBUMIN 2.7* 2.3*    No results for input(s): LIPASE, AMYLASE in the last 168 hours. No results for input(s): AMMONIA in the last 168 hours. CBC:  Recent Labs Lab 06/13/14 2121 06/14/14 0909 06/15/14 0645  WBC 15.0* 10.1 11.2*  NEUTROABS 12.8*  --   --   HGB 8.9* 8.0* 7.2*  HCT 26.7* 23.5* 22.0*  MCV 87.8 89.0 89.1  PLT 345 290 262   Cardiac Enzymes: No results for input(s): CKTOTAL, CKMB, CKMBINDEX, TROPONINI in the last 168 hours. BNP (last 3 results) No results for input(s): PROBNP in the last 8760 hours. CBG:  Recent Labs Lab 06/15/14 0817  GLUCAP 100*    Recent Results (from the past 240 hour(s))  Urine culture     Status: None   Collection Time: 06/13/14 10:00 PM  Result Value Ref Range Status   Specimen Description URINE, CATHETERIZED  Final   Special Requests NONE  Final   Colony Count NO GROWTH Performed at [facility]   Final   Culture NO GROWTH Performed at [facility]   Final   Report Status 06/15/2014 FINAL  Final         Studies: US Renal  06/14/2014   CLINICAL DATA:  Acute renal failure.  Hypertension.  EXAM: RENAL/URINARY TRACT ULTRASOUND COMPLETE  COMPARISON:  None  FINDINGS: Right Kidney:  Length: 14.1 cm cysts are identified measuring 0.9 cm and 2.3 cm in the upper pole region. Echogenicity is normal. Moderate hydronephrosis is present.  Left Kidney:  Length: 13.4 cm. Echogenicity is normal. No solid or cystic mass. Moderate hydronephrosis is present.  Bladder:  Bladder wall thickened. There is an irregular solid and vascular mass within the base the bladder measuring 6.0 x 6.8 x 7 2 cm. The prostate gland appears enlarged measuring 5.2 x 4.4 x 5.8 cm. Is difficult to separate the mass at the basal bladder from the prostate gland.  IMPRESSION: 1. Bilateral hydronephrosis. 2. Mass at the base the bladder may be the cause hydronephrosis and is suspicious for malignancy. Considerations include bladder or prostate primary. Blood clot is felt to be  unlikely given the presence of blood flow within the mass. Further evaluation with contrast-enhanced CT of the abdomen and pelvis is recommended. If the patient is not a candidate for contrast, noncontrast CT of the abdomen and pelvis would be suggested. 3. Small right renal cysts, benign in their appearance. 4. These results will be called to the ordering clinician or representative by the Radiologist Assistant, and communication documented in the PACS or zVision Dashboard.   Electronically Signed   By: Shon Hale M.D.   On: 06/14/2014 08:20   Dg Abd Acute W/chest  06/13/2014   CLINICAL DATA:  Difficulty urinating for 2 days. Diarrhea for 3 days. Low abdominal pain.  EXAM: ACUTE ABDOMEN SERIES (ABDOMEN 2 VIEW & CHEST 1 VIEW)  COMPARISON:  Chest x-ray 10/18/2008  FINDINGS: There is hyperinflation of the lungs compatible with COPD. Chronic changes/ scarring in the lungs. Heart is normal size. No acute airspace opacities  or effusions.  Nonobstructive bowel gas pattern. No free air. No organomegaly or suspicious calcification.  No acute bony abnormality. Degenerative changes throughout the thoracolumbar spine.  IMPRESSION: No evidence of bowel obstruction or free air.  COPD/chronic changes in the lungs.  No active disease.   Electronically Signed   By: Rolm Baptise M.D.   On: 06/13/2014 22:45        Scheduled Meds: . cefTRIAXone (ROCEPHIN)  IV  1 g Intravenous Q24H  . docusate sodium  100 mg Oral BID  . sodium chloride  3 mL Intravenous Q12H   Continuous Infusions:   Active Problems:   Urinary obstruction   Acute renal failure   Hyperkalemia   UTI (lower urinary tract infection)    Time spent: 40 minutes.    Vernell Leep, MD, FACP, FHM. [facility] Pager [phone]  If 7PM-7AM, please contact night-coverage [email or web address] Password [email or web address] 06/15/2014, 8:50 AM    LOS: 2 days

## 2014-06-15 NOTE — Progress Notes (Signed)
Critical lab result was just received and MD was notified of Hgb of 6.9 from 7.2.

## 2014-06-16 ENCOUNTER — Inpatient Hospital Stay (HOSPITAL_COMMUNITY): Payer: Commercial Managed Care - HMO

## 2014-06-16 ENCOUNTER — Encounter (HOSPITAL_COMMUNITY): Payer: Self-pay | Admitting: Radiology

## 2014-06-16 LAB — CBC
HCT: 22.4 % — ABNORMAL LOW (ref 39.0–52.0)
HEMOGLOBIN: 7.6 g/dL — AB (ref 13.0–17.0)
MCH: 30.4 pg (ref 26.0–34.0)
MCHC: 33.9 g/dL (ref 30.0–36.0)
MCV: 89.6 fL (ref 78.0–100.0)
PLATELETS: 204 10*3/uL (ref 150–400)
RBC: 2.5 MIL/uL — ABNORMAL LOW (ref 4.22–5.81)
RDW: 14.5 % (ref 11.5–15.5)
WBC: 12.9 10*3/uL — ABNORMAL HIGH (ref 4.0–10.5)

## 2014-06-16 LAB — BASIC METABOLIC PANEL
Anion gap: 11 (ref 5–15)
BUN: 71 mg/dL — ABNORMAL HIGH (ref 6–23)
CALCIUM: 7.7 mg/dL — AB (ref 8.4–10.5)
CO2: 16 mmol/L — ABNORMAL LOW (ref 19–32)
CREATININE: 4.77 mg/dL — AB (ref 0.50–1.35)
Chloride: 107 mEq/L (ref 96–112)
GFR calc Af Amer: 12 mL/min — ABNORMAL LOW (ref >90)
GFR, EST NON AFRICAN AMERICAN: 10 mL/min — AB (ref >90)
GLUCOSE: 78 mg/dL (ref 70–99)
Potassium: 4.8 mmol/L (ref 3.5–5.1)
SODIUM: 134 mmol/L — AB (ref 135–145)

## 2014-06-16 LAB — PROTIME-INR
INR: 1.64 — ABNORMAL HIGH (ref 0.00–1.49)
Prothrombin Time: 19.5 seconds — ABNORMAL HIGH (ref 11.6–15.2)

## 2014-06-16 MED ORDER — FENTANYL CITRATE 0.05 MG/ML IJ SOLN
INTRAMUSCULAR | Status: AC | PRN
  Administered 2014-06-16 (×2): 50 ug via INTRAVENOUS

## 2014-06-16 MED ORDER — MIDAZOLAM HCL 2 MG/2ML IJ SOLN
INTRAMUSCULAR | Status: AC
  Filled 2014-06-16: qty 4

## 2014-06-16 MED ORDER — SODIUM CHLORIDE 0.9 % IV SOLN
INTRAVENOUS | Status: AC
  Administered 2014-06-16 – 2014-06-17 (×2): via INTRAVENOUS

## 2014-06-16 MED ORDER — CIPROFLOXACIN IN D5W 400 MG/200ML IV SOLN
400.0000 mg | Freq: Once | INTRAVENOUS | Status: AC
  Administered 2014-06-16: 400 mg via INTRAVENOUS
  Filled 2014-06-16: qty 200

## 2014-06-16 MED ORDER — MIDAZOLAM HCL 2 MG/2ML IJ SOLN
INTRAMUSCULAR | Status: AC | PRN
  Administered 2014-06-16 (×2): 1 mg via INTRAVENOUS

## 2014-06-16 MED ORDER — FENTANYL CITRATE 0.05 MG/ML IJ SOLN
INTRAMUSCULAR | Status: AC
  Filled 2014-06-16: qty 2

## 2014-06-16 MED ORDER — LIDOCAINE HCL 1 % IJ SOLN
INTRAMUSCULAR | Status: AC
  Filled 2014-06-16: qty 20

## 2014-06-16 MED ORDER — IOHEXOL 300 MG/ML  SOLN
50.0000 mL | Freq: Once | INTRAMUSCULAR | Status: AC | PRN
  Administered 2014-06-16: 30 mL via INTRAVENOUS

## 2014-06-16 NOTE — Sedation Documentation (Signed)
Successful bilateral nephrostomy tubes placed

## 2014-06-16 NOTE — Procedures (Signed)
Procedure:  Bilateral percutaneous nephrostomy tube placement Findings:  Moderate hydronephrosis bilaterally.  Bilateral 10 Fr PCN's placed and formed in renal pelvis bilaterally.  Attached to gravity drainage bags.

## 2014-06-16 NOTE — Progress Notes (Signed)
Utilization review completed.  

## 2014-06-16 NOTE — Progress Notes (Signed)
Patient c/o pain in penis. Feel like pressure and burns. Only 20 ml of bloody urine in foley since emptied around 6 P.M. Bladder scan patient and got 886 ml in bladder.  Dr. Louis Meckel on call and made aware of the above. See orders to irrigate w/ 20-30 ml of N.S. For total of 60 ml. Irrigated w/ 20 ml of N.S. and went in without any resistance and came back out an no clots seen. Had good out of 925 ml of bloody urine. Patient feels much better and tol. Well.

## 2014-06-16 NOTE — Progress Notes (Signed)
PROGRESS NOTE    [name] Ferrell DPO:[id number] DOB: 08/06/1927 DOA: 06/13/2014 PCP: [name], MD  Primary Urologist: Dr. Carolan Clines  HPI/Brief narrative 80 year old male patient with history of essential hypertension, recently seen in initial visit by urology for? Prostate cancer, presented to the ED with difficulty urinating and ankle swelling. In the ED, noted to be in urinary retention and past 2.5 L of dark brown urine following Foley placement and had abnormal labs: Creatinine 13.41 and potassium 6.5.    Assessment/Plan:  Acute urinary retention with bilateral hydronephrosis: Likely secondary to urinary bladder base mass. Foley placed. Urology consultation appreciated. Follow-up CT abdomen and pelvis without contrast. Management per urology.  Mass at urinary bladder base: DD-prostate cancer versus bladder cancer. Management per urology-plan for possible biopsy -may need transfer to [facility]- will arrange if urology wants  Acute renal failure/hyperkalemia/anion gap metabolic acidosis: Secondary to obstructive uropathy from problem #1 and 2. Improving after Foley catheter placement. Metabolic acidosis and hyperkalemia resolved. Creatinine was improving but has plateaued over the last 12 hours.CT Abd shows: Extensive local spread of prostate cancer throughout the pelvis with large heterogeneous mass invading the bladder base and seminal vesicles. There is extensive pelvic lymphadenopathy with resulting bilateral hydronephrosis. Foley catheter is in place with probable adjacent blood in the bladder lumen. The bladder remains distended   Urinary tract infection: UA was impressive. Urine culture is negative but would continue IV Rocephin given Foley catheter and still possible UTI.  Anemia: Mild acute on chronic.  transfusion if less than 7 g per DL.  Essential hypertension: Controlled/soft blood pressures. Not on medications at this time.   Code Status: Full Family  Communication: None at bedside Disposition Plan: Home when medically stable   Consultants:  Urology  Procedures:  Foley catheter  Antibiotics:  Rocephin IV  Subjective: No overnight events  Objective: Filed Vitals:   06/15/14 1300 06/15/14 1830 06/15/14 2045 06/16/14 0441  BP: 109/43 111/47 127/51 105/40  Pulse: 88 85 86 87  Temp: 98.1 F (36.7 C)  99.2 F (37.3 C) 98.6 F (37 C)  TempSrc: Oral Oral Oral Oral  Resp: 18 20 19 17   Height:      Weight:    93.4 kg (205 lb 14.6 oz)  SpO2: 94% 98% 100% 98%    Intake/Output Summary (Last 24 hours) at 06/16/14 0827 Last data filed at 06/16/14 4315  Gross per 24 hour  Intake   2395 ml  Output   2275 ml  Net    120 ml   Filed Weights   06/13/14 2045 06/14/14 0045 06/16/14 0441  Weight: 78.472 kg (173 lb) 92.4 kg (203 lb 11.3 oz) 93.4 kg (205 lb 14.6 oz)     Exam:  General exam: Pleasant elderly male lying comfortably in bed, a+Ox3, NAD Respiratory system: Clear. No increased work of breathing. Cardiovascular system: S1 & S2 heard, RRR. No JVD, murmurs, gallops, clicks or pedal edema. Telemetry: Sinus rhythm. Gastrointestinal system: Abdomen is nondistended, soft and nontender. Normal bowel sounds heard. Extremities: Symmetric 5 x 5 power.   Data Reviewed: Basic Metabolic Panel:  Recent Labs Lab 06/14/14 0909 06/14/14 1229 06/14/14 1650 06/15/14 0645 06/15/14 1655 06/16/14 0616  NA 139 139 137 136 136 134*  K 4.6 4.5 4.9 4.9 4.7 4.8  CL 109 109 107 109 106 107  CO2 18* 21 20 17* 21 16*  GLUCOSE 100* 122* 136* 102* 104* 78  BUN 98* 88* 84* 89* 72* 71*  CREATININE 6.71* 5.52*  5.19* 5.63* 4.36* 4.77*  CALCIUM 8.3* 8.3* 8.2* 8.1* 8.0* 7.7*  MG 2.5  --   --   --   --   --   PHOS 4.3  --   --   --   --   --    Liver Function Tests:  Recent Labs Lab 06/13/14 2121 06/14/14 0909  AST 16 13  ALT 18 17  ALKPHOS 88 74  BILITOT 1.6* 1.5*  PROT 6.4 5.7*  ALBUMIN 2.7* 2.3*   No results for input(s):  LIPASE, AMYLASE in the last 168 hours. No results for input(s): AMMONIA in the last 168 hours. CBC:  Recent Labs Lab 06/13/14 2121 06/14/14 0909 06/15/14 0645 06/15/14 1655 06/16/14 0616  WBC 15.0* 10.1 11.2* 12.2* 12.9*  NEUTROABS 12.8*  --   --   --   --   HGB 8.9* 8.0* 7.2* 6.9* 7.6*  HCT 26.7* 23.5* 22.0* 20.9* 22.4*  MCV 87.8 89.0 89.1 89.7 89.6  PLT 345 290 262 241 204   Cardiac Enzymes: No results for input(s): CKTOTAL, CKMB, CKMBINDEX, TROPONINI in the last 168 hours. BNP (last 3 results) No results for input(s): PROBNP in the last 8760 hours. CBG:  Recent Labs Lab 06/15/14 0817 06/15/14 1206 06/15/14 1628  GLUCAP 100* 99 95    Recent Results (from the past 240 hour(s))  Urine culture     Status: None   Collection Time: 06/13/14 10:00 PM  Result Value Ref Range Status   Specimen Description URINE, CATHETERIZED  Final   Special Requests NONE  Final   Colony Count NO GROWTH Performed at [facility]   Final   Culture NO GROWTH Performed at [facility]   Final   Report Status 06/15/2014 FINAL  Final         Studies: Ct Abdomen Pelvis Wo Contrast  06/15/2014   CLINICAL DATA:  80 year old with untreated prostate cancer for 20 years. Evaluate for osseous metastatic disease and pelvic lymphadenopathy. Initial encounter.  EXAM: CT ABDOMEN AND PELVIS WITHOUT CONTRAST  TECHNIQUE: Multidetector CT imaging of the abdomen and pelvis was performed following the standard protocol without IV contrast.  COMPARISON:  Abdominal ultrasound 06/14/2014. Report only from CT performed 03/26/2000.  FINDINGS: Lower chest: There is probable chronic pleural thickening in the right hemithorax with adjacent extrapleural fat deposition. There are calcified granulomas in both lung bases and mild subpleural reticulation.  Hepatobiliary: As evaluated in the noncontrast state, the liver appears normal without focal abnormality. No evidence of gallstones, gallbladder wall  thickening or biliary dilatation.  Pancreas: Unremarkable. No pancreatic ductal dilatation or surrounding inflammatory changes.  Spleen: Normal in size without focal abnormality aside from small calcified granulomas.  Adrenals/Urinary Tract: Both adrenal glands appear normal.As demonstrated on ultrasound, there is bilateral hydronephrosis without evidence of urinary tract calculus. A 2.6 cm lesion projecting anteriorly from the upper pole of the right kidney (best seen on the reformatted images) corresponds with a cyst on ultrasound. The bladder is thick walled and distended. There is a large heterogeneous mass within the bladder base which is probably due to a combination of direct extension of prostate cancer into the bladder lumen and adjacent blood clot. Foley catheter has been placed in the interval.  Stomach/Bowel: No evidence of bowel wall thickening, distention or surrounding inflammatory change.Moderate diverticular changes are present throughout the distal colon without surrounding inflammatory change or extraluminal fluid collection.  Vascular/Lymphatic: There is extensive pelvic lymphadenopathy surrounding the heterogeneously enlarged prostate mass which extends into  the bladder lumen. The individual nodes are difficult to differentiate from adjacent muscles without contrast, although measure approximately 7.2 x 4.4 cm on the right and 3.4 x 4.6 cm on the left (image 65). Additional nodes include a left internal iliac node measuring 1.9 cm short axis on image 62 and a right inguinal node measuring 2.1 cm on image 74. Moderate aortoiliac atherosclerosis noted.  Reproductive: As above, there is heterogeneous enlargement of the prostate gland with probable direct extension into the bladder lumen, seminal vesicles and surrounding pelvic lymph nodes. Because of the lack of intravenous contrast and presumed adjacent blood clot in the bladder lumen, the prostate gland is difficult to accurately measure,  although measures approximately 9.3 x 8.4 cm transverse on image 71.  Other: There are postsurgical changes in the left inguinal region with phleboliths or surgical clips in both inguinal canals.  Musculoskeletal: No acute or significant osseous findings. There are degenerative changes throughout the spine without suspicious blastic lesion.  IMPRESSION: 1. Extensive local spread of prostate cancer throughout the pelvis with large heterogeneous mass invading the bladder base and seminal vesicles. There is extensive pelvic lymphadenopathy with resulting bilateral hydronephrosis. Foley catheter is in place with probable adjacent blood in the bladder lumen. The bladder remains distended. 2. No metastatic disease identified within the abdomen or bones.   Electronically Signed   By: Camie Patience M.D.   On: 06/15/2014 10:41        Scheduled Meds: . cefTRIAXone (ROCEPHIN)  IV  1 g Intravenous Q24H  . docusate sodium  100 mg Oral BID  . sodium chloride  3 mL Intravenous Q12H   Continuous Infusions: . sodium chloride 125 mL/hr at 06/16/14 0106    Active Problems:   Urinary obstruction   Acute renal failure   Hyperkalemia   UTI (lower urinary tract infection)    Time spent: 25 minutes.    Yzabella Crunk, DO. [facility] Pager [phone]  If 7PM-7AM, please contact night-coverage [email or web address] Password [email or web address] 06/16/2014, 8:27 AM    LOS: 3 days

## 2014-06-16 NOTE — Progress Notes (Signed)
Referring Physician(s): Dr. Gaynelle Arabian  Subjective: 80 yo male with hx of prostate cancer essentially untreated since 1995. Now admitted with ARF and urinary retention. Workup has found extensive spread of prostate cancer throughout the pelvis with mass invasion of the bladder causing secondary hydronephrosis. Urology has consulted on the pt, requests IR to place (B)perc nephrostomy drains as the pt renal function is worsening as well. Chart, PMHx, meds, imaging reviewed. States he ate some fruit and juice this am. Had a couple sips of ginger ale about an hour ago. Imaging and case reviewed by Dr. Kathlene Cote  Allergies: Penicillins  Medications: Prior to Admission medications   Medication Sig Start Date End Date Taking? Authorizing Provider  aspirin 81 MG tablet Take 81 mg by mouth daily.   Yes Historical Provider, MD  Cholecalciferol (VITAMIN D3) 2000 UNITS TABS Take 2,000 Units by mouth daily.   Yes Historical Provider, MD  lisinopril (PRINIVIL,ZESTRIL) 10 MG tablet Take 10 mg by mouth daily.   Yes Historical Provider, MD  naproxen sodium (ANAPROX) 550 MG tablet Take 550 mg by mouth 2 (two) times daily with a meal.   Yes Historical Provider, MD  OVER THE COUNTER MEDICATION Take 1 tablet by mouth daily. ubiqunol   Yes Historical Provider, MD    Review of Systems  Vital Signs: BP 105/40 mmHg  Pulse 87  Temp(Src) 98.6 F (37 C) (Oral)  Resp 17  Ht 5\' 11"  (1.803 m)  Wt 205 lb 14.6 oz (93.4 kg)  BMI 28.73 kg/m2  SpO2 98%  Physical Exam ENT: unremarkable airway Lungs: CTA without w/r/r Heart: Regular Abdomen: soft, NT   Imaging: Ct Abdomen Pelvis Wo Contrast  06/15/2014   CLINICAL DATA:  80 year old with untreated prostate cancer for 20 years. Evaluate for osseous metastatic disease and pelvic lymphadenopathy. Initial encounter.  EXAM: CT ABDOMEN AND PELVIS WITHOUT CONTRAST  TECHNIQUE: Multidetector CT imaging of the abdomen and pelvis was performed following the standard  protocol without IV contrast.  COMPARISON:  Abdominal ultrasound 06/14/2014. Report only from CT performed 03/26/2000.  FINDINGS: Lower chest: There is probable chronic pleural thickening in the right hemithorax with adjacent extrapleural fat deposition. There are calcified granulomas in both lung bases and mild subpleural reticulation.  Hepatobiliary: As evaluated in the noncontrast state, the liver appears normal without focal abnormality. No evidence of gallstones, gallbladder wall thickening or biliary dilatation.  Pancreas: Unremarkable. No pancreatic ductal dilatation or surrounding inflammatory changes.  Spleen: Normal in size without focal abnormality aside from small calcified granulomas.  Adrenals/Urinary Tract: Both adrenal glands appear normal.As demonstrated on ultrasound, there is bilateral hydronephrosis without evidence of urinary tract calculus. A 2.6 cm lesion projecting anteriorly from the upper pole of the right kidney (best seen on the reformatted images) corresponds with a cyst on ultrasound. The bladder is thick walled and distended. There is a large heterogeneous mass within the bladder base which is probably due to a combination of direct extension of prostate cancer into the bladder lumen and adjacent blood clot. Foley catheter has been placed in the interval.  Stomach/Bowel: No evidence of bowel wall thickening, distention or surrounding inflammatory change.Moderate diverticular changes are present throughout the distal colon without surrounding inflammatory change or extraluminal fluid collection.  Vascular/Lymphatic: There is extensive pelvic lymphadenopathy surrounding the heterogeneously enlarged prostate mass which extends into the bladder lumen. The individual nodes are difficult to differentiate from adjacent muscles without contrast, although measure approximately 7.2 x 4.4 cm on the right and 3.4 x 4.6 cm on  the left (image 65). Additional nodes include a left internal iliac node  measuring 1.9 cm short axis on image 62 and a right inguinal node measuring 2.1 cm on image 74. Moderate aortoiliac atherosclerosis noted.  Reproductive: As above, there is heterogeneous enlargement of the prostate gland with probable direct extension into the bladder lumen, seminal vesicles and surrounding pelvic lymph nodes. Because of the lack of intravenous contrast and presumed adjacent blood clot in the bladder lumen, the prostate gland is difficult to accurately measure, although measures approximately 9.3 x 8.4 cm transverse on image 71.  Other: There are postsurgical changes in the left inguinal region with phleboliths or surgical clips in both inguinal canals.  Musculoskeletal: No acute or significant osseous findings. There are degenerative changes throughout the spine without suspicious blastic lesion.  IMPRESSION: 1. Extensive local spread of prostate cancer throughout the pelvis with large heterogeneous mass invading the bladder base and seminal vesicles. There is extensive pelvic lymphadenopathy with resulting bilateral hydronephrosis. Foley catheter is in place with probable adjacent blood in the bladder lumen. The bladder remains distended. 2. No metastatic disease identified within the abdomen or bones.   Electronically Signed   By: Camie Patience M.D.   On: 06/15/2014 10:41   US Renal  06/14/2014   CLINICAL DATA:  Acute renal failure.  Hypertension.  EXAM: RENAL/URINARY TRACT ULTRASOUND COMPLETE  COMPARISON:  None  FINDINGS: Right Kidney:  Length: 14.1 cm cysts are identified measuring 0.9 cm and 2.3 cm in the upper pole region. Echogenicity is normal. Moderate hydronephrosis is present.  Left Kidney:  Length: 13.4 cm. Echogenicity is normal. No solid or cystic mass. Moderate hydronephrosis is present.  Bladder:  Bladder wall thickened. There is an irregular solid and vascular mass within the base the bladder measuring 6.0 x 6.8 x 7 2 cm. The prostate gland appears enlarged measuring 5.2 x 4.4 x  5.8 cm. Is difficult to separate the mass at the basal bladder from the prostate gland.  IMPRESSION: 1. Bilateral hydronephrosis. 2. Mass at the base the bladder may be the cause hydronephrosis and is suspicious for malignancy. Considerations include bladder or prostate primary. Blood clot is felt to be unlikely given the presence of blood flow within the mass. Further evaluation with contrast-enhanced CT of the abdomen and pelvis is recommended. If the patient is not a candidate for contrast, noncontrast CT of the abdomen and pelvis would be suggested. 3. Small right renal cysts, benign in their appearance. 4. These results will be called to the ordering clinician or representative by the Radiologist Assistant, and communication documented in the PACS or zVision Dashboard.   Electronically Signed   By: Shon Hale M.D.   On: 06/14/2014 08:20   Dg Abd Acute W/chest  06/13/2014   CLINICAL DATA:  Difficulty urinating for 2 days. Diarrhea for 3 days. Low abdominal pain.  EXAM: ACUTE ABDOMEN SERIES (ABDOMEN 2 VIEW & CHEST 1 VIEW)  COMPARISON:  Chest x-ray 10/18/2008  FINDINGS: There is hyperinflation of the lungs compatible with COPD. Chronic changes/ scarring in the lungs. Heart is normal size. No acute airspace opacities or effusions.  Nonobstructive bowel gas pattern. No free air. No organomegaly or suspicious calcification.  No acute bony abnormality. Degenerative changes throughout the thoracolumbar spine.  IMPRESSION: No evidence of bowel obstruction or free air.  COPD/chronic changes in the lungs.  No active disease.   Electronically Signed   By: Rolm Baptise M.D.   On: 06/13/2014 22:45    Labs:  CBC:  Recent Labs  06/14/14 0909 06/15/14 0645 06/15/14 1655 06/16/14 0616  WBC 10.1 11.2* 12.2* 12.9*  HGB 8.0* 7.2* 6.9* 7.6*  HCT 23.5* 22.0* 20.9* 22.4*  PLT 290 262 241 204    COAGS: No results for input(s): INR, APTT in the last 8760 hours.  BMP:  Recent Labs  06/14/14 1650  06/15/14 0645 06/15/14 1655 06/16/14 0616  NA 137 136 136 134*  K 4.9 4.9 4.7 4.8  CL 107 109 106 107  CO2 20 17* 21 16*  GLUCOSE 136* 102* 104* 78  BUN 84* 89* 72* 71*  CALCIUM 8.2* 8.1* 8.0* 7.7*  CREATININE 5.19* 5.63* 4.36* 4.77*  GFRNONAA 9* 8* 11* 10*  GFRAA 11* 10* 13* 12*    LIVER FUNCTION TESTS:  Recent Labs  06/13/14 2121 06/14/14 0909  BILITOT 1.6* 1.5*  AST 16 13  ALT 18 17  ALKPHOS 88 74  PROT 6.4 5.7*  ALBUMIN 2.7* 2.3*    Assessment and Plan: Obstructive uropathy with (B)hydronephrosis secondary to extensive prostate cancer. ARF secondary to above Plan for (B)PCN placement today. Explained need for PCN as well as procedure risks, complications, use of sedation Labs reviewed. Consent signed in chart    I spent a total of 20 minutes face to face in clinical consultation/evaluation, greater than 50% of which was counseling/coordinating care for placement of nephrostomy tubes.  SignedAscencion Dike 06/16/2014, 11:51 AM

## 2014-06-16 NOTE — Progress Notes (Signed)
Physical Therapy Treatment Patient Details Name: PARRISH BONN MRN: [id number] DOB: 20-Jun-1927 Today's Date: 06/16/2014    History of Present Illness 80 y.o. male admitted with Acute renal failure/hyperkalemia/anion gap metabolic acidosis: Secondary to obstructive uropathy. Found to have Mass at urinary bladder base. Hx of prostate cancer.    PT Comments    Pt was seen for evaluation of current status, now demonstrating some better endurance but looking somewhat fragile today.  He is scheduled for a procedure but despite recent improvements in his physical function he is anxious.  Plan is to go home with family and receive PT with HHPT.  Plan is guardedly still in place.  Follow Up Recommendations  Home health PT;Supervision/Assistance - 24 hour     Equipment Recommendations  None recommended by PT    Recommendations for Other Services OT consult     Precautions / Restrictions Precautions Precautions: Fall Restrictions Weight Bearing Restrictions: No    Mobility  Bed Mobility Overal bed mobility: Needs Assistance Bed Mobility: Sidelying to Sit   Sidelying to sit: Min assist       General bed mobility comments: tired today and anxious about a procedure  Transfers Overall transfer level: Needs assistance Equipment used: Rolling walker (2 wheeled) Transfers: Sit to/from [facility] Sit to Stand: Min guard Stand pivot transfers: Min guard          Ambulation/Gait Ambulation/Gait assistance: Min guard Ambulation Distance (Feet): 300 Feet Assistive device: Rolling walker (2 wheeled);1 person hand held assist Gait Pattern/deviations: Step-through pattern;Wide base of support;Drifts right/left;Decreased dorsiflexion - right;Decreased dorsiflexion - left Gait velocity: decreased Gait velocity interpretation: Below normal speed for age/gender General Gait Details:  Pt was so unsteady PT gave him a walker and discussed energy conservation and  safety   Stairs            Wheelchair Mobility    Modified Rankin (Stroke Patients Only)       Balance Overall balance assessment: Needs assistance Sitting-balance support: Feet supported Sitting balance-Leahy Scale: Good   Postural control: Posterior lean Standing balance support: Bilateral upper extremity supported Standing balance-Leahy Scale: Fair                      Cognition Arousal/Alertness: Awake/alert Behavior During Therapy: WFL for tasks assessed/performed Overall Cognitive Status: Within Functional Limits for tasks assessed                      Exercises      General Comments General comments (skin integrity, edema, etc.): urine is purple in appearance in cath line, pt attached to IV and foley cath      Pertinent Vitals/Pain Pain Assessment:  (stubbed his toe at home)    Home Living                      Prior Function            PT Goals (current goals can now be found in the care plan section) Acute Rehab PT Goals Patient Stated Goal: go home Progress towards PT goals: Progressing toward goals    Frequency  Min 3X/week    PT Plan Current plan remains appropriate    Co-evaluation             End of Session Equipment Utilized During Treatment: Other (comment) (FWW) Activity Tolerance: Patient tolerated treatment well Patient left: in bed;with call bell/phone within reach     Time: 9983-3825  PT Time Calculation (min) (ACUTE ONLY): 25 min  Charges:  $Gait Training: 8-22 mins $Therapeutic Activity: 8-22 mins                    G Codes:      Ramond Dial 2014-06-19, 2:45 PM   Mee Hives, PT MS Acute Rehab Dept. Number: [phone]

## 2014-06-16 NOTE — Consult Note (Signed)
Subjective: The patient reports blood in urine, with single clot passed overnight. [age] yo male, H0 functional status with  PCa: Original Dx 1995, Dr. Reece Agar. Pt treated with observation per PCP, Dr. Kenton Kingfisher, with "gradual rising psa over the last several years". More recently, had psa measured at 37, and again at 26.5 ( per pt history). Pt has denied voiding symptoms, until ER presentation. (NB) Pt concerned with changing insurance status Jan1.    Currently: 2. ARF with  Bilateral hydro, and bladder base mass, and massive perivesical nodes. Doubt JJ stent can be passed from below. Probable Pca, but could be Ca bladder.  3. Hyperkalemia/acidosis 4. UTI  Objective: Vital signs in last 24 hours: Temp:  [98.1 F (36.7 C)-99.2 F (37.3 C)] 98.6 F (37 C) (12/30 0441) Pulse Rate:  [85-88] 87 (12/30 0441) Resp:  [17-20] 17 (12/30 0441) BP: (105-127)/(40-51) 105/40 mmHg (12/30 0441) SpO2:  [94 %-100 %] 98 % (12/30 0441) Weight:  [93.4 kg (205 lb 14.6 oz)] 93.4 kg (205 lb 14.6 oz) (12/30 0441)A  Intake/Output from previous day: 12/29 0701 - 12/30 0700 In: 2395 [P.O.:560; Blood:335] Out: 2275 [Urine:2275] Intake/Output this shift:    Past Medical History  Diagnosis Date  . Cancer   . Hypertension     Physical Exam:  Lungs - Normal respiratory effort, chest expands symmetrically.  Abdomen - Soft, non-tender & non-distended.  Lab Results:  Recent Labs  06/15/14 0645 06/15/14 1655 06/16/14 0616  WBC 11.2* 12.2* 12.9*  HGB 7.2* 6.9* 7.6*  HCT 22.0* 20.9* 22.4*   BMET  Recent Labs  06/15/14 1655 06/16/14 0616  NA 136 134*  K 4.7 4.8  CL 106 107  CO2 21 16*  GLUCOSE 104* 78  BUN 72* 71*  CREATININE 4.36* 4.77*  CALCIUM 8.0* 7.7*   No results for input(s): LABURIN in the last 72 hours. Results for orders placed or performed during the hospital encounter of 06/13/14  Urine culture     Status: None   Collection Time: 06/13/14 10:00 PM  Result Value Ref Range  Status   Specimen Description URINE, CATHETERIZED  Final   Special Requests NONE  Final   Colony Count NO GROWTH Performed at [facility]   Final   Culture NO GROWTH Performed at [facility]   Final   Report Status 06/15/2014 FINAL  Final    Studies/Results: Ct Abdomen Pelvis Wo Contrast  06/15/2014   CLINICAL DATA:  80 year old with untreated prostate cancer for 20 years. Evaluate for osseous metastatic disease and pelvic lymphadenopathy. Initial encounter.  EXAM: CT ABDOMEN AND PELVIS WITHOUT CONTRAST  TECHNIQUE: Multidetector CT imaging of the abdomen and pelvis was performed following the standard protocol without IV contrast.  COMPARISON:  Abdominal ultrasound 06/14/2014. Report only from CT performed 03/26/2000.  FINDINGS: Lower chest: There is probable chronic pleural thickening in the right hemithorax with adjacent extrapleural fat deposition. There are calcified granulomas in both lung bases and mild subpleural reticulation.  Hepatobiliary: As evaluated in the noncontrast state, the liver appears normal without focal abnormality. No evidence of gallstones, gallbladder wall thickening or biliary dilatation.  Pancreas: Unremarkable. No pancreatic ductal dilatation or surrounding inflammatory changes.  Spleen: Normal in size without focal abnormality aside from small calcified granulomas.  Adrenals/Urinary Tract: Both adrenal glands appear normal.As demonstrated on ultrasound, there is bilateral hydronephrosis without evidence of urinary tract calculus. A 2.6 cm lesion projecting anteriorly from the upper pole of the right kidney (best seen on the reformatted images) corresponds  with a cyst on ultrasound. The bladder is thick walled and distended. There is a large heterogeneous mass within the bladder base which is probably due to a combination of direct extension of prostate cancer into the bladder lumen and adjacent blood clot. Foley catheter has been placed in the interval.   Stomach/Bowel: No evidence of bowel wall thickening, distention or surrounding inflammatory change.Moderate diverticular changes are present throughout the distal colon without surrounding inflammatory change or extraluminal fluid collection.  Vascular/Lymphatic: There is extensive pelvic lymphadenopathy surrounding the heterogeneously enlarged prostate mass which extends into the bladder lumen. The individual nodes are difficult to differentiate from adjacent muscles without contrast, although measure approximately 7.2 x 4.4 cm on the right and 3.4 x 4.6 cm on the left (image 65). Additional nodes include a left internal iliac node measuring 1.9 cm short axis on image 62 and a right inguinal node measuring 2.1 cm on image 74. Moderate aortoiliac atherosclerosis noted.  Reproductive: As above, there is heterogeneous enlargement of the prostate gland with probable direct extension into the bladder lumen, seminal vesicles and surrounding pelvic lymph nodes. Because of the lack of intravenous contrast and presumed adjacent blood clot in the bladder lumen, the prostate gland is difficult to accurately measure, although measures approximately 9.3 x 8.4 cm transverse on image 71.  Other: There are postsurgical changes in the left inguinal region with phleboliths or surgical clips in both inguinal canals.  Musculoskeletal: No acute or significant osseous findings. There are degenerative changes throughout the spine without suspicious blastic lesion.  IMPRESSION: 1. Extensive local spread of prostate cancer throughout the pelvis with large heterogeneous mass invading the bladder base and seminal vesicles. There is extensive pelvic lymphadenopathy with resulting bilateral hydronephrosis. Foley catheter is in place with probable adjacent blood in the bladder lumen. The bladder remains distended. 2. No metastatic disease identified within the abdomen or bones.   Electronically Signed   By: Camie Patience M.D.   On: 06/15/2014  10:41    Assessment:  Pt is awake and alert. Daughter and son coming in from distant homes to see father today. Although pt did not want any treatment for cancer in the past, he now wants all treatment, because " I am not ready to die".   I think he needs bx of bladder or perivesical mass for tissue confirmation, and possible perc nephrostomy. I will review xrays with radiology today. Once tissue dx confirmed, he will need hormone Rx initiation.  Plan: 1. Tissue bx 2. Possible perc nephrostomy 3. Bone scan vs Na-Fl PET scan 4, Serum T level. ( If T level is low-predictor of poor prognosis. If normal, then should respond to hormone therapy). Repeat psa.  5. Consideration for cysto, clot evacuyation and TUR bx of bladder mass (  Would need at [facility]). Deandrea Vanpelt I 06/16/2014, 7:46 AM

## 2014-06-17 ENCOUNTER — Inpatient Hospital Stay (HOSPITAL_COMMUNITY): Payer: Commercial Managed Care - HMO

## 2014-06-17 DIAGNOSIS — D62 Acute posthemorrhagic anemia: Secondary | ICD-10-CM

## 2014-06-17 LAB — PSA: PSA: 36.97 ng/mL — ABNORMAL HIGH (ref <=4.00)

## 2014-06-17 LAB — PREPARE RBC (CROSSMATCH)

## 2014-06-17 LAB — CBC
HEMATOCRIT: 19.8 % — AB (ref 39.0–52.0)
HEMOGLOBIN: 6.6 g/dL — AB (ref 13.0–17.0)
MCH: 30 pg (ref 26.0–34.0)
MCHC: 33.3 g/dL (ref 30.0–36.0)
MCV: 90 fL (ref 78.0–100.0)
Platelets: 207 10*3/uL (ref 150–400)
RBC: 2.2 MIL/uL — ABNORMAL LOW (ref 4.22–5.81)
RDW: 14.3 % (ref 11.5–15.5)
WBC: 18.9 10*3/uL — ABNORMAL HIGH (ref 4.0–10.5)

## 2014-06-17 LAB — HEMOGLOBIN AND HEMATOCRIT, BLOOD
HCT: 20.1 % — ABNORMAL LOW (ref 39.0–52.0)
Hemoglobin: 7 g/dL — ABNORMAL LOW (ref 13.0–17.0)

## 2014-06-17 LAB — BASIC METABOLIC PANEL
Anion gap: 8 (ref 5–15)
BUN: 46 mg/dL — ABNORMAL HIGH (ref 6–23)
CHLORIDE: 111 meq/L (ref 96–112)
CO2: 17 mmol/L — AB (ref 19–32)
Calcium: 7.9 mg/dL — ABNORMAL LOW (ref 8.4–10.5)
Creatinine, Ser: 2.26 mg/dL — ABNORMAL HIGH (ref 0.50–1.35)
GFR calc Af Amer: 29 mL/min — ABNORMAL LOW (ref >90)
GFR, EST NON AFRICAN AMERICAN: 25 mL/min — AB (ref >90)
GLUCOSE: 110 mg/dL — AB (ref 70–99)
Potassium: 4.5 mmol/L (ref 3.5–5.1)
SODIUM: 136 mmol/L (ref 135–145)

## 2014-06-17 LAB — TESTOSTERONE: Testosterone: 83 ng/dL — ABNORMAL LOW (ref 300–890)

## 2014-06-17 MED ORDER — BICALUTAMIDE 50 MG PO TABS
50.0000 mg | ORAL_TABLET | Freq: Every day | ORAL | Status: DC
  Administered 2014-06-17 – 2014-06-21 (×5): 50 mg via ORAL
  Filled 2014-06-17 (×7): qty 1

## 2014-06-17 MED ORDER — SODIUM CHLORIDE 0.9 % IV SOLN
Freq: Once | INTRAVENOUS | Status: AC
  Administered 2014-06-17: 18:00:00 via INTRAVENOUS

## 2014-06-17 MED ORDER — SODIUM CHLORIDE 0.9 % IV SOLN
Freq: Once | INTRAVENOUS | Status: AC
  Administered 2014-06-17: 05:00:00 via INTRAVENOUS

## 2014-06-17 MED ORDER — TRIAMCINOLONE ACETONIDE 0.5 % EX CREA
TOPICAL_CREAM | Freq: Two times a day (BID) | CUTANEOUS | Status: AC
  Administered 2014-06-17: 13:00:00 via TOPICAL
  Filled 2014-06-17: qty 15

## 2014-06-17 MED ORDER — TECHNETIUM TC 99M MEDRONATE IV KIT
25.0000 | PACK | Freq: Once | INTRAVENOUS | Status: AC | PRN
  Administered 2014-06-17: 25 via INTRAVENOUS

## 2014-06-17 MED ORDER — GI COCKTAIL ~~LOC~~
30.0000 mL | Freq: Once | ORAL | Status: AC
  Administered 2014-06-17: 30 mL via ORAL
  Filled 2014-06-17: qty 30

## 2014-06-17 MED ORDER — SODIUM CHLORIDE 0.9 % IV SOLN
INTRAVENOUS | Status: DC
  Administered 2014-06-17: 21:00:00 via INTRAVENOUS

## 2014-06-17 MED ORDER — SODIUM CHLORIDE 0.9 % IV SOLN: Freq: Once | INTRAVENOUS | Status: DC

## 2014-06-17 NOTE — Progress Notes (Signed)
Medicare Important Message given? YES  (If response is "NO", the following Medicare IM given date fields will be blank)  Date Medicare IM given: 06/17/14 Medicare IM given by:  Taviana Westergren  

## 2014-06-17 NOTE — Consult Note (Signed)
Subjective: The patient reports gross hematuria worsening yesterday afternoon, and continuing. Foley draining bloody urine this Am without clots.  Bilateral percutaneous nephrostomy placed yesterday per IR. Urine slightly bloody without clots. Pt reports no pain. Thinking clearly. Yesterday, I had 20 minute phone conversation with daughter Publishing copy) regarding father's diagnosis and changing short term and longer term Urologic diagnostic and treatment plan.   Note: Pt established CaP 1995 per Dr. Reece Agar. Tissue not available for review. Pt now needs fresh tissue for diagnosis. Note pt declined radical prostatectomy initially, and has been followed by his primary care, with  Stable psa for several years. He has had slowly rising psa for several years ( hx per pt), with PSA 10, and 12.5 in last 2 years. Primary care MD documented that pt declined Urology referral, until this year's PSA of >26.  He was seen and scheduled for PBx and Na-Fl PET scan, but developed  Acute Renal Failure, and gross hematuria over the weekend, and was admitted. He has been found to have large bladder mass-thought to be locally invasive prostate cancer into bladder with bladder hematoma, and markedly enlarged bilateral pelvic lymph nodes, causing bilateral hydronephrosis, and ARF. He now has foley catheter in place, with bilateral perc nephrostomies, with much improved serum creatinine this AM.    Note serum Testosterone=83. This serves as a BAD prognostic sign; indicating that hormone therapy will be of limited value for Mr. [name] Ferrell.    I spoke with Mr. [name] Ferrell regarding his potential diagnosis of metastatic cancer, to be sure he wanted to be treated, because he had refused treatment or further evaluation for so many years; and he has indicated that he now desires treatment " because I am not ready to die". He understands that this means that he will need multiple procedures, x-rays, therapies, etc. We will try to explain things to  his as best as we can.     Objective: Vital signs in last 24 hours: Temp:  [97.3 F (36.3 C)-98.6 F (37 C)] 98.4 F (36.9 C) (12/31 0557) Pulse Rate:  [88-107] 95 (12/31 0557) Resp:  [16-25] 16 (12/31 0557) BP: (91-135)/(40-63) 125/40 mmHg (12/31 0557) SpO2:  [96 %-100 %] 98 % (12/31 0557) Weight:  [94 kg (207 lb 3.7 oz)] 94 kg (207 lb 3.7 oz) (12/31 0434)A  Intake/Output from previous day: 12/30 0701 - 12/31 0700 In: 2910 [P.O.:240; I.V.:625; Blood:300] Out: 1915 [Urine:1915] Intake/Output this shift: Total I/O In: -  Out: 175 [Urine:175]  Past Medical History  Diagnosis Date  . Cancer   . Hypertension     Physical Exam:  Lungs - Normal respiratory effort, chest expands symmetrically.  Abdomen - Soft, non-tender & non-distended.  Lab Results:  Recent Labs  06/15/14 1655 06/16/14 0616 06/17/14 0254  WBC 12.2* 12.9* 18.9*  HGB 6.9* 7.6* 6.6*  HCT 20.9* 22.4* 19.8*  Testosterone=83. BMET  Recent Labs  06/16/14 0616 06/17/14 0254  NA 134* 136  K 4.8 4.5  CL 107 111  CO2 16* 17*  GLUCOSE 78 110*  BUN 71* 46*  CREATININE 4.77* 2.26*  CALCIUM 7.7* 7.9*   No results for input(s): LABURIN in the last 72 hours. Results for orders placed or performed during the hospital encounter of 06/13/14  Urine culture     Status: None   Collection Time: 06/13/14 10:00 PM  Result Value Ref Range Status   Specimen Description URINE, CATHETERIZED  Final   Special Requests NONE  Final   Colony Count NO GROWTH Performed  at [facility]   Final   Culture NO GROWTH Performed at [facility]   Final   Report Status 06/15/2014 FINAL  Final    Studies/Results: Ir Perc Nephrostomy Left  06/16/2014   CLINICAL DATA:  Acute renal failure with bilateral obstructive uropathy at the level of the bladder and distal ureters secondary to progressive prostate carcinoma. The patient requires bilateral percutaneous nephrostomy tube placement.  EXAM: 1. ULTRASOUND  GUIDANCE FOR PUNCTURE OF THE RIGHT RENAL COLLECTING SYSTEM. 2. RIGHT PERCUTANEOUS NEPHROSTOMY TUBE PLACEMENT. 3. ULTRASOUND GUIDANCE FOR PUNCTURE OF THE LEFT RENAL COLLECTING SYSTEM. 4. LEFT PERCUTANEOUS NEPHROSTOMY TUBE PLACEMENT.  COMPARISON:  CT OF THE ABDOMEN AND PELVIS ON 06/15/2014  ANESTHESIA/SEDATION: 2.0 mg IV Versed; 100 mcg IV Fentanyl.  Total Moderate Sedation Time  25 minutes  CONTRAST:  30 ml Omnipaque 300  MEDICATIONS: 400 mg IV Cipro. Ciprofloxacin was given within two hours of incision.  FLUOROSCOPY TIME:  2.0 minutes.  PROCEDURE: The procedure, risks, benefits, and alternatives were explained to the patient. Questions regarding the procedure were encouraged and answered. The patient understands and consents to the procedure.  Bilateral flank regions were prepped with Betadine in a sterile fashion, and a sterile drape was applied covering the operative field. A sterile gown and sterile gloves were used for the procedure. Local anesthesia was provided with 1% Lidocaine. A time-out procedure was performed.  Ultrasound was used to localize both kidneys. Under direct ultrasound guidance, a 21 gauge needle was advanced into the right renal collecting system. Ultrasound image documentation was performed. Aspiration of urine sample was performed followed by contrast injection.  A transitional dilator was advanced over a guidewire. Percutaneous tract dilatation was then performed over the guidewire. A 10 French percutaneous nephrostomy tube was then advanced and formed in the collecting system. Catheter position was confirmed by fluoroscopy after contrast injection.  A 21 gauge needle was then advanced under ultrasound guidance into the left renal collecting system. Aspiration of urine was followed by contrast injection. A guidewire was advanced through the needle. A transitional dilator was placed. The tract was dilated and a 10 French nephrostomy tube advanced and formed in the collecting system. Catheter  position confirmed by fluoroscopy after contrast injection.  Both catheters were secured at the skin with Prolene retention sutures and adhesive StatLock devices. Both were connected to gravity drainage bags.  COMPLICATIONS: None.  FINDINGS: Ultrasound confirms moderate to moderately severe hydronephrosis bilaterally. Bilateral percutaneous nephrostomy tubes were able to be successfully placed with both catheters formed at the level of the renal pelvis. The collecting systems are partially duplicated bilaterally. Both tubes are draining well after placement.  IMPRESSION: Placement of bilateral 10 French percutaneous nephrostomy tubes to treat obstructive uropathy. Ten French catheters were placed and formed in the renal pelvis bilaterally. These were connected to gravity drainage bags. Urine output will be followed.   Electronically Signed   By: Aletta Edouard M.D.   On: 06/16/2014 17:21   Ir Perc Nephrostomy Right  06/16/2014   CLINICAL DATA:  Acute renal failure with bilateral obstructive uropathy at the level of the bladder and distal ureters secondary to progressive prostate carcinoma. The patient requires bilateral percutaneous nephrostomy tube placement.  EXAM: 1. ULTRASOUND GUIDANCE FOR PUNCTURE OF THE RIGHT RENAL COLLECTING SYSTEM. 2. RIGHT PERCUTANEOUS NEPHROSTOMY TUBE PLACEMENT. 3. ULTRASOUND GUIDANCE FOR PUNCTURE OF THE LEFT RENAL COLLECTING SYSTEM. 4. LEFT PERCUTANEOUS NEPHROSTOMY TUBE PLACEMENT.  COMPARISON:  CT OF THE ABDOMEN AND PELVIS ON 06/15/2014  ANESTHESIA/SEDATION: 2.0  mg IV Versed; 100 mcg IV Fentanyl.  Total Moderate Sedation Time  25 minutes  CONTRAST:  30 ml Omnipaque 300  MEDICATIONS: 400 mg IV Cipro. Ciprofloxacin was given within two hours of incision.  FLUOROSCOPY TIME:  2.0 minutes.  PROCEDURE: The procedure, risks, benefits, and alternatives were explained to the patient. Questions regarding the procedure were encouraged and answered. The patient understands and consents to the  procedure.  Bilateral flank regions were prepped with Betadine in a sterile fashion, and a sterile drape was applied covering the operative field. A sterile gown and sterile gloves were used for the procedure. Local anesthesia was provided with 1% Lidocaine. A time-out procedure was performed.  Ultrasound was used to localize both kidneys. Under direct ultrasound guidance, a 21 gauge needle was advanced into the right renal collecting system. Ultrasound image documentation was performed. Aspiration of urine sample was performed followed by contrast injection.  A transitional dilator was advanced over a guidewire. Percutaneous tract dilatation was then performed over the guidewire. A 10 French percutaneous nephrostomy tube was then advanced and formed in the collecting system. Catheter position was confirmed by fluoroscopy after contrast injection.  A 21 gauge needle was then advanced under ultrasound guidance into the left renal collecting system. Aspiration of urine was followed by contrast injection. A guidewire was advanced through the needle. A transitional dilator was placed. The tract was dilated and a 10 French nephrostomy tube advanced and formed in the collecting system. Catheter position confirmed by fluoroscopy after contrast injection.  Both catheters were secured at the skin with Prolene retention sutures and adhesive StatLock devices. Both were connected to gravity drainage bags.  COMPLICATIONS: None.  FINDINGS: Ultrasound confirms moderate to moderately severe hydronephrosis bilaterally. Bilateral percutaneous nephrostomy tubes were able to be successfully placed with both catheters formed at the level of the renal pelvis. The collecting systems are partially duplicated bilaterally. Both tubes are draining well after placement.  IMPRESSION: Placement of bilateral 10 French percutaneous nephrostomy tubes to treat obstructive uropathy. Ten French catheters were placed and formed in the renal pelvis  bilaterally. These were connected to gravity drainage bags. Urine output will be followed.   Electronically Signed   By: Aletta Edouard M.D.   On: 06/16/2014 17:21   Ir US Guide Bx Asp/drain  06/16/2014   CLINICAL DATA:  Acute renal failure with bilateral obstructive uropathy at the level of the bladder and distal ureters secondary to progressive prostate carcinoma. The patient requires bilateral percutaneous nephrostomy tube placement.  EXAM: 1. ULTRASOUND GUIDANCE FOR PUNCTURE OF THE RIGHT RENAL COLLECTING SYSTEM. 2. RIGHT PERCUTANEOUS NEPHROSTOMY TUBE PLACEMENT. 3. ULTRASOUND GUIDANCE FOR PUNCTURE OF THE LEFT RENAL COLLECTING SYSTEM. 4. LEFT PERCUTANEOUS NEPHROSTOMY TUBE PLACEMENT.  COMPARISON:  CT OF THE ABDOMEN AND PELVIS ON 06/15/2014  ANESTHESIA/SEDATION: 2.0 mg IV Versed; 100 mcg IV Fentanyl.  Total Moderate Sedation Time  25 minutes  CONTRAST:  30 ml Omnipaque 300  MEDICATIONS: 400 mg IV Cipro. Ciprofloxacin was given within two hours of incision.  FLUOROSCOPY TIME:  2.0 minutes.  PROCEDURE: The procedure, risks, benefits, and alternatives were explained to the patient. Questions regarding the procedure were encouraged and answered. The patient understands and consents to the procedure.  Bilateral flank regions were prepped with Betadine in a sterile fashion, and a sterile drape was applied covering the operative field. A sterile gown and sterile gloves were used for the procedure. Local anesthesia was provided with 1% Lidocaine. A time-out procedure was performed.  Ultrasound was used to  localize both kidneys. Under direct ultrasound guidance, a 21 gauge needle was advanced into the right renal collecting system. Ultrasound image documentation was performed. Aspiration of urine sample was performed followed by contrast injection.  A transitional dilator was advanced over a guidewire. Percutaneous tract dilatation was then performed over the guidewire. A 10 French percutaneous nephrostomy tube was then  advanced and formed in the collecting system. Catheter position was confirmed by fluoroscopy after contrast injection.  A 21 gauge needle was then advanced under ultrasound guidance into the left renal collecting system. Aspiration of urine was followed by contrast injection. A guidewire was advanced through the needle. A transitional dilator was placed. The tract was dilated and a 10 French nephrostomy tube advanced and formed in the collecting system. Catheter position confirmed by fluoroscopy after contrast injection.  Both catheters were secured at the skin with Prolene retention sutures and adhesive StatLock devices. Both were connected to gravity drainage bags.  COMPLICATIONS: None.  FINDINGS: Ultrasound confirms moderate to moderately severe hydronephrosis bilaterally. Bilateral percutaneous nephrostomy tubes were able to be successfully placed with both catheters formed at the level of the renal pelvis. The collecting systems are partially duplicated bilaterally. Both tubes are draining well after placement.  IMPRESSION: Placement of bilateral 10 French percutaneous nephrostomy tubes to treat obstructive uropathy. Ten French catheters were placed and formed in the renal pelvis bilaterally. These were connected to gravity drainage bags. Urine output will be followed.   Electronically Signed   By: Aletta Edouard M.D.   On: 06/16/2014 17:21   Ir US Guide Bx Asp/drain  06/16/2014   CLINICAL DATA:  Acute renal failure with bilateral obstructive uropathy at the level of the bladder and distal ureters secondary to progressive prostate carcinoma. The patient requires bilateral percutaneous nephrostomy tube placement.  EXAM: 1. ULTRASOUND GUIDANCE FOR PUNCTURE OF THE RIGHT RENAL COLLECTING SYSTEM. 2. RIGHT PERCUTANEOUS NEPHROSTOMY TUBE PLACEMENT. 3. ULTRASOUND GUIDANCE FOR PUNCTURE OF THE LEFT RENAL COLLECTING SYSTEM. 4. LEFT PERCUTANEOUS NEPHROSTOMY TUBE PLACEMENT.  COMPARISON:  CT OF THE ABDOMEN AND PELVIS ON  06/15/2014  ANESTHESIA/SEDATION: 2.0 mg IV Versed; 100 mcg IV Fentanyl.  Total Moderate Sedation Time  25 minutes  CONTRAST:  30 ml Omnipaque 300  MEDICATIONS: 400 mg IV Cipro. Ciprofloxacin was given within two hours of incision.  FLUOROSCOPY TIME:  2.0 minutes.  PROCEDURE: The procedure, risks, benefits, and alternatives were explained to the patient. Questions regarding the procedure were encouraged and answered. The patient understands and consents to the procedure.  Bilateral flank regions were prepped with Betadine in a sterile fashion, and a sterile drape was applied covering the operative field. A sterile gown and sterile gloves were used for the procedure. Local anesthesia was provided with 1% Lidocaine. A time-out procedure was performed.  Ultrasound was used to localize both kidneys. Under direct ultrasound guidance, a 21 gauge needle was advanced into the right renal collecting system. Ultrasound image documentation was performed. Aspiration of urine sample was performed followed by contrast injection.  A transitional dilator was advanced over a guidewire. Percutaneous tract dilatation was then performed over the guidewire. A 10 French percutaneous nephrostomy tube was then advanced and formed in the collecting system. Catheter position was confirmed by fluoroscopy after contrast injection.  A 21 gauge needle was then advanced under ultrasound guidance into the left renal collecting system. Aspiration of urine was followed by contrast injection. A guidewire was advanced through the needle. A transitional dilator was placed. The tract was dilated and a 10 [place]  nephrostomy tube advanced and formed in the collecting system. Catheter position confirmed by fluoroscopy after contrast injection.  Both catheters were secured at the skin with Prolene retention sutures and adhesive StatLock devices. Both were connected to gravity drainage bags.  COMPLICATIONS: None.  FINDINGS: Ultrasound confirms moderate to  moderately severe hydronephrosis bilaterally. Bilateral percutaneous nephrostomy tubes were able to be successfully placed with both catheters formed at the level of the renal pelvis. The collecting systems are partially duplicated bilaterally. Both tubes are draining well after placement.  IMPRESSION: Placement of bilateral 10 French percutaneous nephrostomy tubes to treat obstructive uropathy. Ten French catheters were placed and formed in the renal pelvis bilaterally. These were connected to gravity drainage bags. Urine output will be followed.   Electronically Signed   By: Aletta Edouard M.D.   On: 06/16/2014 17:21    Assessment:1. ARF: much improved since bilateral percs 2. Gross hematuria, probably  2ndary to dissolving bladder hematoma. No pain, and no clot.  3. Probable metastatic CaP. New PSA is 36, Typical lymph node metastatic pattern. Tumor appears particularly aggressive, with T=83 only, indicating probable poor response to hormone therapy. However, will ck bone scan while in house, and begin Casodex TID, in preparation for full hormone therapy trial.  4. Preference would be for discharge from [facility], and to have office prostate biopsy to get fresh tissue diagnosis, and to be certain that this adenocarcinoma of the prostate, and not 2ndary neuroendocrine tumor of the prostate; and not bladder cancer or rectal cancer. In addition, pt could begin hormone therapy as outpatient, and will need eventual Oncology consultation with Dr. Alen Blew .  Plan:  1. Immediate plan: a. Leave foley in place. Irrigate by hand with sterile water 60-120cc and toomey syringe prn clots.                                              b. Bone scan vs Na-Fl PET scan while in house looking for bone mets.                                             c. Begin casodex 3x/day in preparation for hormone therapy            2. Longer term plan: discharge from [facility]: Office prostate biopsy, possible hormone therapy to begin next week,  and Oncology consult.    [name] Ferrell I 06/17/2014, 8:13 AM

## 2014-06-17 NOTE — Progress Notes (Signed)
Subjective: Pt feeling ok this am. S/p (B)PCN yesterday Some c/o soreness from drains  Objective: Physical Exam: BP 125/48 mmHg  Pulse 96  Temp(Src) 98.2 F (36.8 C) (Oral)  Resp 16  Ht 5\' 11"  (1.803 m)  Wt 207 lb 3.7 oz (94 kg)  BMI 28.92 kg/m2  SpO2 99% (B)PCN drains intact, sites clean, dry, mildly tender. Drains each with good output, sl blood tinge  Labs: CBC  Recent Labs  06/16/14 0616 06/17/14 0254  WBC 12.9* 18.9*  HGB 7.6* 6.6*  HCT 22.4* 19.8*  PLT 204 207   BMET  Recent Labs  06/16/14 0616 06/17/14 0254  NA 134* 136  K 4.8 4.5  CL 107 111  CO2 16* 17*  GLUCOSE 78 110*  BUN 71* 46*  CREATININE 4.77* 2.26*  CALCIUM 7.7* 7.9*   LFT No results for input(s): PROT, ALBUMIN, AST, ALT, ALKPHOS, BILITOT, BILIDIR, IBILI, LIPASE in the last 72 hours. PT/INR  Recent Labs  06/16/14 1144  LABPROT 19.5*  INR 1.64*     Studies/Results: Ct Abdomen Pelvis Wo Contrast  06/15/2014   CLINICAL DATA:  80 year old with untreated prostate cancer for 20 years. Evaluate for osseous metastatic disease and pelvic lymphadenopathy. Initial encounter.  EXAM: CT ABDOMEN AND PELVIS WITHOUT CONTRAST  TECHNIQUE: Multidetector CT imaging of the abdomen and pelvis was performed following the standard protocol without IV contrast.  COMPARISON:  Abdominal ultrasound 06/14/2014. Report only from CT performed 03/26/2000.  FINDINGS: Lower chest: There is probable chronic pleural thickening in the right hemithorax with adjacent extrapleural fat deposition. There are calcified granulomas in both lung bases and mild subpleural reticulation.  Hepatobiliary: As evaluated in the noncontrast state, the liver appears normal without focal abnormality. No evidence of gallstones, gallbladder wall thickening or biliary dilatation.  Pancreas: Unremarkable. No pancreatic ductal dilatation or surrounding inflammatory changes.  Spleen: Normal in size without focal abnormality aside from small calcified  granulomas.  Adrenals/Urinary Tract: Both adrenal glands appear normal.As demonstrated on ultrasound, there is bilateral hydronephrosis without evidence of urinary tract calculus. A 2.6 cm lesion projecting anteriorly from the upper pole of the right kidney (best seen on the reformatted images) corresponds with a cyst on ultrasound. The bladder is thick walled and distended. There is a large heterogeneous mass within the bladder base which is probably due to a combination of direct extension of prostate cancer into the bladder lumen and adjacent blood clot. Foley catheter has been placed in the interval.  Stomach/Bowel: No evidence of bowel wall thickening, distention or surrounding inflammatory change.Moderate diverticular changes are present throughout the distal colon without surrounding inflammatory change or extraluminal fluid collection.  Vascular/Lymphatic: There is extensive pelvic lymphadenopathy surrounding the heterogeneously enlarged prostate mass which extends into the bladder lumen. The individual nodes are difficult to differentiate from adjacent muscles without contrast, although measure approximately 7.2 x 4.4 cm on the right and 3.4 x 4.6 cm on the left (image 65). Additional nodes include a left internal iliac node measuring 1.9 cm short axis on image 62 and a right inguinal node measuring 2.1 cm on image 74. Moderate aortoiliac atherosclerosis noted.  Reproductive: As above, there is heterogeneous enlargement of the prostate gland with probable direct extension into the bladder lumen, seminal vesicles and surrounding pelvic lymph nodes. Because of the lack of intravenous contrast and presumed adjacent blood clot in the bladder lumen, the prostate gland is difficult to accurately measure, although measures approximately 9.3 x 8.4 cm transverse on image 71.  Other: There are  postsurgical changes in the left inguinal region with phleboliths or surgical clips in both inguinal canals.   Musculoskeletal: No acute or significant osseous findings. There are degenerative changes throughout the spine without suspicious blastic lesion.  IMPRESSION: 1. Extensive local spread of prostate cancer throughout the pelvis with large heterogeneous mass invading the bladder base and seminal vesicles. There is extensive pelvic lymphadenopathy with resulting bilateral hydronephrosis. Foley catheter is in place with probable adjacent blood in the bladder lumen. The bladder remains distended. 2. No metastatic disease identified within the abdomen or bones.   Electronically Signed   By: Camie Patience M.D.   On: 06/15/2014 10:41   Ir Perc Nephrostomy Left  06/16/2014   CLINICAL DATA:  Acute renal failure with bilateral obstructive uropathy at the level of the bladder and distal ureters secondary to progressive prostate carcinoma. The patient requires bilateral percutaneous nephrostomy tube placement.  EXAM: 1. ULTRASOUND GUIDANCE FOR PUNCTURE OF THE RIGHT RENAL COLLECTING SYSTEM. 2. RIGHT PERCUTANEOUS NEPHROSTOMY TUBE PLACEMENT. 3. ULTRASOUND GUIDANCE FOR PUNCTURE OF THE LEFT RENAL COLLECTING SYSTEM. 4. LEFT PERCUTANEOUS NEPHROSTOMY TUBE PLACEMENT.  COMPARISON:  CT OF THE ABDOMEN AND PELVIS ON 06/15/2014  ANESTHESIA/SEDATION: 2.0 mg IV Versed; 100 mcg IV Fentanyl.  Total Moderate Sedation Time  25 minutes  CONTRAST:  30 ml Omnipaque 300  MEDICATIONS: 400 mg IV Cipro. Ciprofloxacin was given within two hours of incision.  FLUOROSCOPY TIME:  2.0 minutes.  PROCEDURE: The procedure, risks, benefits, and alternatives were explained to the patient. Questions regarding the procedure were encouraged and answered. The patient understands and consents to the procedure.  Bilateral flank regions were prepped with Betadine in a sterile fashion, and a sterile drape was applied covering the operative field. A sterile gown and sterile gloves were used for the procedure. Local anesthesia was provided with 1% Lidocaine. A time-out  procedure was performed.  Ultrasound was used to localize both kidneys. Under direct ultrasound guidance, a 21 gauge needle was advanced into the right renal collecting system. Ultrasound image documentation was performed. Aspiration of urine sample was performed followed by contrast injection.  A transitional dilator was advanced over a guidewire. Percutaneous tract dilatation was then performed over the guidewire. A 10 French percutaneous nephrostomy tube was then advanced and formed in the collecting system. Catheter position was confirmed by fluoroscopy after contrast injection.  A 21 gauge needle was then advanced under ultrasound guidance into the left renal collecting system. Aspiration of urine was followed by contrast injection. A guidewire was advanced through the needle. A transitional dilator was placed. The tract was dilated and a 10 French nephrostomy tube advanced and formed in the collecting system. Catheter position confirmed by fluoroscopy after contrast injection.  Both catheters were secured at the skin with Prolene retention sutures and adhesive StatLock devices. Both were connected to gravity drainage bags.  COMPLICATIONS: None.  FINDINGS: Ultrasound confirms moderate to moderately severe hydronephrosis bilaterally. Bilateral percutaneous nephrostomy tubes were able to be successfully placed with both catheters formed at the level of the renal pelvis. The collecting systems are partially duplicated bilaterally. Both tubes are draining well after placement.  IMPRESSION: Placement of bilateral 10 French percutaneous nephrostomy tubes to treat obstructive uropathy. Ten French catheters were placed and formed in the renal pelvis bilaterally. These were connected to gravity drainage bags. Urine output will be followed.   Electronically Signed   By: Aletta Edouard M.D.   On: 06/16/2014 17:21   Ir Perc Nephrostomy Right  06/16/2014   CLINICAL DATA:  Acute renal failure with bilateral obstructive  uropathy at the level of the bladder and distal ureters secondary to progressive prostate carcinoma. The patient requires bilateral percutaneous nephrostomy tube placement.  EXAM: 1. ULTRASOUND GUIDANCE FOR PUNCTURE OF THE RIGHT RENAL COLLECTING SYSTEM. 2. RIGHT PERCUTANEOUS NEPHROSTOMY TUBE PLACEMENT. 3. ULTRASOUND GUIDANCE FOR PUNCTURE OF THE LEFT RENAL COLLECTING SYSTEM. 4. LEFT PERCUTANEOUS NEPHROSTOMY TUBE PLACEMENT.  COMPARISON:  CT OF THE ABDOMEN AND PELVIS ON 06/15/2014  ANESTHESIA/SEDATION: 2.0 mg IV Versed; 100 mcg IV Fentanyl.  Total Moderate Sedation Time  25 minutes  CONTRAST:  30 ml Omnipaque 300  MEDICATIONS: 400 mg IV Cipro. Ciprofloxacin was given within two hours of incision.  FLUOROSCOPY TIME:  2.0 minutes.  PROCEDURE: The procedure, risks, benefits, and alternatives were explained to the patient. Questions regarding the procedure were encouraged and answered. The patient understands and consents to the procedure.  Bilateral flank regions were prepped with Betadine in a sterile fashion, and a sterile drape was applied covering the operative field. A sterile gown and sterile gloves were used for the procedure. Local anesthesia was provided with 1% Lidocaine. A time-out procedure was performed.  Ultrasound was used to localize both kidneys. Under direct ultrasound guidance, a 21 gauge needle was advanced into the right renal collecting system. Ultrasound image documentation was performed. Aspiration of urine sample was performed followed by contrast injection.  A transitional dilator was advanced over a guidewire. Percutaneous tract dilatation was then performed over the guidewire. A 10 French percutaneous nephrostomy tube was then advanced and formed in the collecting system. Catheter position was confirmed by fluoroscopy after contrast injection.  A 21 gauge needle was then advanced under ultrasound guidance into the left renal collecting system. Aspiration of urine was followed by contrast  injection. A guidewire was advanced through the needle. A transitional dilator was placed. The tract was dilated and a 10 French nephrostomy tube advanced and formed in the collecting system. Catheter position confirmed by fluoroscopy after contrast injection.  Both catheters were secured at the skin with Prolene retention sutures and adhesive StatLock devices. Both were connected to gravity drainage bags.  COMPLICATIONS: None.  FINDINGS: Ultrasound confirms moderate to moderately severe hydronephrosis bilaterally. Bilateral percutaneous nephrostomy tubes were able to be successfully placed with both catheters formed at the level of the renal pelvis. The collecting systems are partially duplicated bilaterally. Both tubes are draining well after placement.  IMPRESSION: Placement of bilateral 10 French percutaneous nephrostomy tubes to treat obstructive uropathy. Ten French catheters were placed and formed in the renal pelvis bilaterally. These were connected to gravity drainage bags. Urine output will be followed.   Electronically Signed   By: Aletta Edouard M.D.   On: 06/16/2014 17:21   Ir US Guide Bx Asp/drain  06/16/2014   CLINICAL DATA:  Acute renal failure with bilateral obstructive uropathy at the level of the bladder and distal ureters secondary to progressive prostate carcinoma. The patient requires bilateral percutaneous nephrostomy tube placement.  EXAM: 1. ULTRASOUND GUIDANCE FOR PUNCTURE OF THE RIGHT RENAL COLLECTING SYSTEM. 2. RIGHT PERCUTANEOUS NEPHROSTOMY TUBE PLACEMENT. 3. ULTRASOUND GUIDANCE FOR PUNCTURE OF THE LEFT RENAL COLLECTING SYSTEM. 4. LEFT PERCUTANEOUS NEPHROSTOMY TUBE PLACEMENT.  COMPARISON:  CT OF THE ABDOMEN AND PELVIS ON 06/15/2014  ANESTHESIA/SEDATION: 2.0 mg IV Versed; 100 mcg IV Fentanyl.  Total Moderate Sedation Time  25 minutes  CONTRAST:  30 ml Omnipaque 300  MEDICATIONS: 400 mg IV Cipro. Ciprofloxacin was given within two hours of incision.  FLUOROSCOPY TIME:  2.0 minutes.  PROCEDURE: The procedure, risks, benefits, and alternatives were explained to the patient. Questions regarding the procedure were encouraged and answered. The patient understands and consents to the procedure.  Bilateral flank regions were prepped with Betadine in a sterile fashion, and a sterile drape was applied covering the operative field. A sterile gown and sterile gloves were used for the procedure. Local anesthesia was provided with 1% Lidocaine. A time-out procedure was performed.  Ultrasound was used to localize both kidneys. Under direct ultrasound guidance, a 21 gauge needle was advanced into the right renal collecting system. Ultrasound image documentation was performed. Aspiration of urine sample was performed followed by contrast injection.  A transitional dilator was advanced over a guidewire. Percutaneous tract dilatation was then performed over the guidewire. A 10 French percutaneous nephrostomy tube was then advanced and formed in the collecting system. Catheter position was confirmed by fluoroscopy after contrast injection.  A 21 gauge needle was then advanced under ultrasound guidance into the left renal collecting system. Aspiration of urine was followed by contrast injection. A guidewire was advanced through the needle. A transitional dilator was placed. The tract was dilated and a 10 French nephrostomy tube advanced and formed in the collecting system. Catheter position confirmed by fluoroscopy after contrast injection.  Both catheters were secured at the skin with Prolene retention sutures and adhesive StatLock devices. Both were connected to gravity drainage bags.  COMPLICATIONS: None.  FINDINGS: Ultrasound confirms moderate to moderately severe hydronephrosis bilaterally. Bilateral percutaneous nephrostomy tubes were able to be successfully placed with both catheters formed at the level of the renal pelvis. The collecting systems are partially duplicated bilaterally. Both tubes are draining  well after placement.  IMPRESSION: Placement of bilateral 10 French percutaneous nephrostomy tubes to treat obstructive uropathy. Ten French catheters were placed and formed in the renal pelvis bilaterally. These were connected to gravity drainage bags. Urine output will be followed.   Electronically Signed   By: Aletta Edouard M.D.   On: 06/16/2014 17:21   Ir US Guide Bx Asp/drain  06/16/2014   CLINICAL DATA:  Acute renal failure with bilateral obstructive uropathy at the level of the bladder and distal ureters secondary to progressive prostate carcinoma. The patient requires bilateral percutaneous nephrostomy tube placement.  EXAM: 1. ULTRASOUND GUIDANCE FOR PUNCTURE OF THE RIGHT RENAL COLLECTING SYSTEM. 2. RIGHT PERCUTANEOUS NEPHROSTOMY TUBE PLACEMENT. 3. ULTRASOUND GUIDANCE FOR PUNCTURE OF THE LEFT RENAL COLLECTING SYSTEM. 4. LEFT PERCUTANEOUS NEPHROSTOMY TUBE PLACEMENT.  COMPARISON:  CT OF THE ABDOMEN AND PELVIS ON 06/15/2014  ANESTHESIA/SEDATION: 2.0 mg IV Versed; 100 mcg IV Fentanyl.  Total Moderate Sedation Time  25 minutes  CONTRAST:  30 ml Omnipaque 300  MEDICATIONS: 400 mg IV Cipro. Ciprofloxacin was given within two hours of incision.  FLUOROSCOPY TIME:  2.0 minutes.  PROCEDURE: The procedure, risks, benefits, and alternatives were explained to the patient. Questions regarding the procedure were encouraged and answered. The patient understands and consents to the procedure.  Bilateral flank regions were prepped with Betadine in a sterile fashion, and a sterile drape was applied covering the operative field. A sterile gown and sterile gloves were used for the procedure. Local anesthesia was provided with 1% Lidocaine. A time-out procedure was performed.  Ultrasound was used to localize both kidneys. Under direct ultrasound guidance, a 21 gauge needle was advanced into the right renal collecting system. Ultrasound image documentation was performed. Aspiration of urine sample was performed followed by  contrast injection.  A transitional dilator was advanced over a guidewire.  Percutaneous tract dilatation was then performed over the guidewire. A 10 French percutaneous nephrostomy tube was then advanced and formed in the collecting system. Catheter position was confirmed by fluoroscopy after contrast injection.  A 21 gauge needle was then advanced under ultrasound guidance into the left renal collecting system. Aspiration of urine was followed by contrast injection. A guidewire was advanced through the needle. A transitional dilator was placed. The tract was dilated and a 10 French nephrostomy tube advanced and formed in the collecting system. Catheter position confirmed by fluoroscopy after contrast injection.  Both catheters were secured at the skin with Prolene retention sutures and adhesive StatLock devices. Both were connected to gravity drainage bags.  COMPLICATIONS: None.  FINDINGS: Ultrasound confirms moderate to moderately severe hydronephrosis bilaterally. Bilateral percutaneous nephrostomy tubes were able to be successfully placed with both catheters formed at the level of the renal pelvis. The collecting systems are partially duplicated bilaterally. Both tubes are draining well after placement.  IMPRESSION: Placement of bilateral 10 French percutaneous nephrostomy tubes to treat obstructive uropathy. Ten French catheters were placed and formed in the renal pelvis bilaterally. These were connected to gravity drainage bags. Urine output will be followed.   Electronically Signed   By: Aletta Edouard M.D.   On: 06/16/2014 17:21    Assessment/Plan: Bilateral hydronephrosis from obstructive uropathy/prostate cancer S/p (B)PCN Good tube function  Cr down to 2.2 already IR following, Urology plans noted.    LOS: 4 days    Ascencion Dike PA-C 06/17/2014 9:11 AM

## 2014-06-17 NOTE — Progress Notes (Signed)
PROGRESS NOTE    ALSON MCPHEETERS HYQ:[id number] DOB: 1928/02/20 DOA: 06/13/2014 PCP: [name], MD  Primary Urologist: Dr. Carolan Clines  HPI/Brief narrative 80 year old male patient with history of essential hypertension, recently seen in initial visit by urology for? Prostate cancer, presented to the ED with difficulty urinating and ankle swelling. In the ED, noted to be in urinary retention and past 2.5 L of dark brown urine following Foley placement and had abnormal labs: Creatinine 13.41 and potassium 6.5. He has had IR place nephrostomy tubes 12/30   Assessment/Plan:  Acute urinary retention with bilateral hydronephrosis: Likely secondary to urinary bladder base mass. Foley placed. Urology consultation appreciated.  IR consulted and nephrostomy tubes placed  Mass at urinary bladder base: DD-prostate cancer versus bladder cancer. Management per urology: Plan: 1. Immediate plan: a. Leave foley in place. Irrigate by hand with sterile water 60-120cc and toomey syringe prn clots.   b. Bone scan vs Na-Fl PET scan while in house looking for bone mets.  c. Begin casodex 3x/day in preparation for hormone therapy  2. Longer term plan: discharge from [facility]: Office prostate biopsy, possible hormone therapy to begin next week, and  Oncology consult.    Acute renal failure/hyperkalemia/anion gap metabolic acidosis: Secondary to obstructive uropathy from problem #1 and 2. Improving after Foley catheter placement. Metabolic acidosis and hyperkalemia resolved. Creatinine  improving    Urinary tract infection: UA was impressive. Urine culture is negative but would continue IV Rocephin given Foley catheter and still possible UTI.  Anemia: Mild acute on chronic.  transfusion if less than 7 g per DL. -transfused 1 unit this AM- plan to get h/h and transfuse more if needed  Essential  hypertension: Controlled/soft blood pressures. Not on medications at this time.  leucoytosis -monitor    Code Status: Full Family Communication: None at bedside Disposition Plan: Home when ok with urology   Consultants:  Urology  Procedures:  Foley catheter  Nephrostomy tubes  Antibiotics:  Rocephin IV  Subjective: No overnight events  Objective: Filed Vitals:   06/17/14 0434 06/17/14 0523 06/17/14 0557 06/17/14 0841  BP: 112/43 112/53 125/40 125/48  Pulse: 97 93 95 96  Temp: 98.3 F (36.8 C) 98.4 F (36.9 C) 98.4 F (36.9 C) 98.2 F (36.8 C)  TempSrc: Oral Oral Oral Oral  Resp: 16 16 16 16   Height:      Weight: 94 kg (207 lb 3.7 oz)     SpO2: 99% 96% 98% 99%    Intake/Output Summary (Last 24 hours) at 06/17/14 0905 Last data filed at 06/17/14 0841  Gross per 24 hour  Intake   2705 ml  Output   2490 ml  Net    215 ml   Filed Weights   06/14/14 0045 06/16/14 0441 06/17/14 0434  Weight: 92.4 kg (203 lb 11.3 oz) 93.4 kg (205 lb 14.6 oz) 94 kg (207 lb 3.7 oz)     Exam:  General exam: Pleasant elderly male lying comfortably in bed, a+Ox3, NAD Respiratory system: Clear. No increased work of breathing. Cardiovascular system: S1 & S2 heard, RRR. No JVD, murmurs, gallops, clicks or pedal edema. Telemetry: Sinus rhythm. Gastrointestinal system: Abdomen is nondistended, soft and nontender. Normal bowel sounds heard. Extremities: Symmetric 5 x 5 power.   Data Reviewed: Basic Metabolic Panel:  Recent Labs Lab 06/14/14 0909  06/14/14 1650 06/15/14 0645 06/15/14 1655 06/16/14 0616 06/17/14 0254  NA 139  < > 137 136 136 134* 136  K 4.6  < >  4.9 4.9 4.7 4.8 4.5  CL 109  < > 107 109 106 107 111  CO2 18*  < > 20 17* 21 16* 17*  GLUCOSE 100*  < > 136* 102* 104* 78 110*  BUN 98*  < > 84* 89* 72* 71* 46*  CREATININE 6.71*  < > 5.19* 5.63* 4.36* 4.77* 2.26*  CALCIUM 8.3*  < > 8.2* 8.1* 8.0* 7.7* 7.9*  MG 2.5  --   --   --   --   --   --   PHOS 4.3  --    --   --   --   --   --   < > = values in this interval not displayed. Liver Function Tests:  Recent Labs Lab 06/13/14 2121 06/14/14 0909  AST 16 13  ALT 18 17  ALKPHOS 88 74  BILITOT 1.6* 1.5*  PROT 6.4 5.7*  ALBUMIN 2.7* 2.3*   No results for input(s): LIPASE, AMYLASE in the last 168 hours. No results for input(s): AMMONIA in the last 168 hours. CBC:  Recent Labs Lab 06/13/14 2121 06/14/14 0909 06/15/14 0645 06/15/14 1655 06/16/14 0616 06/17/14 0254  WBC 15.0* 10.1 11.2* 12.2* 12.9* 18.9*  NEUTROABS 12.8*  --   --   --   --   --   HGB 8.9* 8.0* 7.2* 6.9* 7.6* 6.6*  HCT 26.7* 23.5* 22.0* 20.9* 22.4* 19.8*  MCV 87.8 89.0 89.1 89.7 89.6 90.0  PLT 345 290 262 241 204 207   Cardiac Enzymes: No results for input(s): CKTOTAL, CKMB, CKMBINDEX, TROPONINI in the last 168 hours. BNP (last 3 results) No results for input(s): PROBNP in the last 8760 hours. CBG:  Recent Labs Lab 06/15/14 0817 06/15/14 1206 06/15/14 1628  GLUCAP 100* 99 95    Recent Results (from the past 240 hour(s))  Urine culture     Status: None   Collection Time: 06/13/14 10:00 PM  Result Value Ref Range Status   Specimen Description URINE, CATHETERIZED  Final   Special Requests NONE  Final   Colony Count NO GROWTH Performed at [facility]   Final   Culture NO GROWTH Performed at [facility]   Final   Report Status 06/15/2014 FINAL  Final         Studies: Ct Abdomen Pelvis Wo Contrast  06/15/2014   CLINICAL DATA:  80 year old with untreated prostate cancer for 20 years. Evaluate for osseous metastatic disease and pelvic lymphadenopathy. Initial encounter.  EXAM: CT ABDOMEN AND PELVIS WITHOUT CONTRAST  TECHNIQUE: Multidetector CT imaging of the abdomen and pelvis was performed following the standard protocol without IV contrast.  COMPARISON:  Abdominal ultrasound 06/14/2014. Report only from CT performed 03/26/2000.  FINDINGS: Lower chest: There is probable chronic  pleural thickening in the right hemithorax with adjacent extrapleural fat deposition. There are calcified granulomas in both lung bases and mild subpleural reticulation.  Hepatobiliary: As evaluated in the noncontrast state, the liver appears normal without focal abnormality. No evidence of gallstones, gallbladder wall thickening or biliary dilatation.  Pancreas: Unremarkable. No pancreatic ductal dilatation or surrounding inflammatory changes.  Spleen: Normal in size without focal abnormality aside from small calcified granulomas.  Adrenals/Urinary Tract: Both adrenal glands appear normal.As demonstrated on ultrasound, there is bilateral hydronephrosis without evidence of urinary tract calculus. A 2.6 cm lesion projecting anteriorly from the upper pole of the right kidney (best seen on the reformatted images) corresponds with a cyst on ultrasound. The bladder is thick walled and distended. There  is a large heterogeneous mass within the bladder base which is probably due to a combination of direct extension of prostate cancer into the bladder lumen and adjacent blood clot. Foley catheter has been placed in the interval.  Stomach/Bowel: No evidence of bowel wall thickening, distention or surrounding inflammatory change.Moderate diverticular changes are present throughout the distal colon without surrounding inflammatory change or extraluminal fluid collection.  Vascular/Lymphatic: There is extensive pelvic lymphadenopathy surrounding the heterogeneously enlarged prostate mass which extends into the bladder lumen. The individual nodes are difficult to differentiate from adjacent muscles without contrast, although measure approximately 7.2 x 4.4 cm on the right and 3.4 x 4.6 cm on the left (image 65). Additional nodes include a left internal iliac node measuring 1.9 cm short axis on image 62 and a right inguinal node measuring 2.1 cm on image 74. Moderate aortoiliac atherosclerosis noted.  Reproductive: As above, there  is heterogeneous enlargement of the prostate gland with probable direct extension into the bladder lumen, seminal vesicles and surrounding pelvic lymph nodes. Because of the lack of intravenous contrast and presumed adjacent blood clot in the bladder lumen, the prostate gland is difficult to accurately measure, although measures approximately 9.3 x 8.4 cm transverse on image 71.  Other: There are postsurgical changes in the left inguinal region with phleboliths or surgical clips in both inguinal canals.  Musculoskeletal: No acute or significant osseous findings. There are degenerative changes throughout the spine without suspicious blastic lesion.  IMPRESSION: 1. Extensive local spread of prostate cancer throughout the pelvis with large heterogeneous mass invading the bladder base and seminal vesicles. There is extensive pelvic lymphadenopathy with resulting bilateral hydronephrosis. Foley catheter is in place with probable adjacent blood in the bladder lumen. The bladder remains distended. 2. No metastatic disease identified within the abdomen or bones.   Electronically Signed   By: Camie Patience M.D.   On: 06/15/2014 10:41   Ir Perc Nephrostomy Left  06/16/2014   CLINICAL DATA:  Acute renal failure with bilateral obstructive uropathy at the level of the bladder and distal ureters secondary to progressive prostate carcinoma. The patient requires bilateral percutaneous nephrostomy tube placement.  EXAM: 1. ULTRASOUND GUIDANCE FOR PUNCTURE OF THE RIGHT RENAL COLLECTING SYSTEM. 2. RIGHT PERCUTANEOUS NEPHROSTOMY TUBE PLACEMENT. 3. ULTRASOUND GUIDANCE FOR PUNCTURE OF THE LEFT RENAL COLLECTING SYSTEM. 4. LEFT PERCUTANEOUS NEPHROSTOMY TUBE PLACEMENT.  COMPARISON:  CT OF THE ABDOMEN AND PELVIS ON 06/15/2014  ANESTHESIA/SEDATION: 2.0 mg IV Versed; 100 mcg IV Fentanyl.  Total Moderate Sedation Time  25 minutes  CONTRAST:  30 ml Omnipaque 300  MEDICATIONS: 400 mg IV Cipro. Ciprofloxacin was given within two hours of  incision.  FLUOROSCOPY TIME:  2.0 minutes.  PROCEDURE: The procedure, risks, benefits, and alternatives were explained to the patient. Questions regarding the procedure were encouraged and answered. The patient understands and consents to the procedure.  Bilateral flank regions were prepped with Betadine in a sterile fashion, and a sterile drape was applied covering the operative field. A sterile gown and sterile gloves were used for the procedure. Local anesthesia was provided with 1% Lidocaine. A time-out procedure was performed.  Ultrasound was used to localize both kidneys. Under direct ultrasound guidance, a 21 gauge needle was advanced into the right renal collecting system. Ultrasound image documentation was performed. Aspiration of urine sample was performed followed by contrast injection.  A transitional dilator was advanced over a guidewire. Percutaneous tract dilatation was then performed over the guidewire. A 10 French percutaneous nephrostomy tube was then  advanced and formed in the collecting system. Catheter position was confirmed by fluoroscopy after contrast injection.  A 21 gauge needle was then advanced under ultrasound guidance into the left renal collecting system. Aspiration of urine was followed by contrast injection. A guidewire was advanced through the needle. A transitional dilator was placed. The tract was dilated and a 10 French nephrostomy tube advanced and formed in the collecting system. Catheter position confirmed by fluoroscopy after contrast injection.  Both catheters were secured at the skin with Prolene retention sutures and adhesive StatLock devices. Both were connected to gravity drainage bags.  COMPLICATIONS: None.  FINDINGS: Ultrasound confirms moderate to moderately severe hydronephrosis bilaterally. Bilateral percutaneous nephrostomy tubes were able to be successfully placed with both catheters formed at the level of the renal pelvis. The collecting systems are partially  duplicated bilaterally. Both tubes are draining well after placement.  IMPRESSION: Placement of bilateral 10 French percutaneous nephrostomy tubes to treat obstructive uropathy. Ten French catheters were placed and formed in the renal pelvis bilaterally. These were connected to gravity drainage bags. Urine output will be followed.   Electronically Signed   By: Aletta Edouard M.D.   On: 06/16/2014 17:21   Ir Perc Nephrostomy Right  06/16/2014   CLINICAL DATA:  Acute renal failure with bilateral obstructive uropathy at the level of the bladder and distal ureters secondary to progressive prostate carcinoma. The patient requires bilateral percutaneous nephrostomy tube placement.  EXAM: 1. ULTRASOUND GUIDANCE FOR PUNCTURE OF THE RIGHT RENAL COLLECTING SYSTEM. 2. RIGHT PERCUTANEOUS NEPHROSTOMY TUBE PLACEMENT. 3. ULTRASOUND GUIDANCE FOR PUNCTURE OF THE LEFT RENAL COLLECTING SYSTEM. 4. LEFT PERCUTANEOUS NEPHROSTOMY TUBE PLACEMENT.  COMPARISON:  CT OF THE ABDOMEN AND PELVIS ON 06/15/2014  ANESTHESIA/SEDATION: 2.0 mg IV Versed; 100 mcg IV Fentanyl.  Total Moderate Sedation Time  25 minutes  CONTRAST:  30 ml Omnipaque 300  MEDICATIONS: 400 mg IV Cipro. Ciprofloxacin was given within two hours of incision.  FLUOROSCOPY TIME:  2.0 minutes.  PROCEDURE: The procedure, risks, benefits, and alternatives were explained to the patient. Questions regarding the procedure were encouraged and answered. The patient understands and consents to the procedure.  Bilateral flank regions were prepped with Betadine in a sterile fashion, and a sterile drape was applied covering the operative field. A sterile gown and sterile gloves were used for the procedure. Local anesthesia was provided with 1% Lidocaine. A time-out procedure was performed.  Ultrasound was used to localize both kidneys. Under direct ultrasound guidance, a 21 gauge needle was advanced into the right renal collecting system. Ultrasound image documentation was performed.  Aspiration of urine sample was performed followed by contrast injection.  A transitional dilator was advanced over a guidewire. Percutaneous tract dilatation was then performed over the guidewire. A 10 French percutaneous nephrostomy tube was then advanced and formed in the collecting system. Catheter position was confirmed by fluoroscopy after contrast injection.  A 21 gauge needle was then advanced under ultrasound guidance into the left renal collecting system. Aspiration of urine was followed by contrast injection. A guidewire was advanced through the needle. A transitional dilator was placed. The tract was dilated and a 10 French nephrostomy tube advanced and formed in the collecting system. Catheter position confirmed by fluoroscopy after contrast injection.  Both catheters were secured at the skin with Prolene retention sutures and adhesive StatLock devices. Both were connected to gravity drainage bags.  COMPLICATIONS: None.  FINDINGS: Ultrasound confirms moderate to moderately severe hydronephrosis bilaterally. Bilateral percutaneous nephrostomy tubes were able to be  successfully placed with both catheters formed at the level of the renal pelvis. The collecting systems are partially duplicated bilaterally. Both tubes are draining well after placement.  IMPRESSION: Placement of bilateral 10 French percutaneous nephrostomy tubes to treat obstructive uropathy. Ten French catheters were placed and formed in the renal pelvis bilaterally. These were connected to gravity drainage bags. Urine output will be followed.   Electronically Signed   By: Aletta Edouard M.D.   On: 06/16/2014 17:21   Ir US Guide Bx Asp/drain  06/16/2014   CLINICAL DATA:  Acute renal failure with bilateral obstructive uropathy at the level of the bladder and distal ureters secondary to progressive prostate carcinoma. The patient requires bilateral percutaneous nephrostomy tube placement.  EXAM: 1. ULTRASOUND GUIDANCE FOR PUNCTURE OF THE  RIGHT RENAL COLLECTING SYSTEM. 2. RIGHT PERCUTANEOUS NEPHROSTOMY TUBE PLACEMENT. 3. ULTRASOUND GUIDANCE FOR PUNCTURE OF THE LEFT RENAL COLLECTING SYSTEM. 4. LEFT PERCUTANEOUS NEPHROSTOMY TUBE PLACEMENT.  COMPARISON:  CT OF THE ABDOMEN AND PELVIS ON 06/15/2014  ANESTHESIA/SEDATION: 2.0 mg IV Versed; 100 mcg IV Fentanyl.  Total Moderate Sedation Time  25 minutes  CONTRAST:  30 ml Omnipaque 300  MEDICATIONS: 400 mg IV Cipro. Ciprofloxacin was given within two hours of incision.  FLUOROSCOPY TIME:  2.0 minutes.  PROCEDURE: The procedure, risks, benefits, and alternatives were explained to the patient. Questions regarding the procedure were encouraged and answered. The patient understands and consents to the procedure.  Bilateral flank regions were prepped with Betadine in a sterile fashion, and a sterile drape was applied covering the operative field. A sterile gown and sterile gloves were used for the procedure. Local anesthesia was provided with 1% Lidocaine. A time-out procedure was performed.  Ultrasound was used to localize both kidneys. Under direct ultrasound guidance, a 21 gauge needle was advanced into the right renal collecting system. Ultrasound image documentation was performed. Aspiration of urine sample was performed followed by contrast injection.  A transitional dilator was advanced over a guidewire. Percutaneous tract dilatation was then performed over the guidewire. A 10 French percutaneous nephrostomy tube was then advanced and formed in the collecting system. Catheter position was confirmed by fluoroscopy after contrast injection.  A 21 gauge needle was then advanced under ultrasound guidance into the left renal collecting system. Aspiration of urine was followed by contrast injection. A guidewire was advanced through the needle. A transitional dilator was placed. The tract was dilated and a 10 French nephrostomy tube advanced and formed in the collecting system. Catheter position confirmed by  fluoroscopy after contrast injection.  Both catheters were secured at the skin with Prolene retention sutures and adhesive StatLock devices. Both were connected to gravity drainage bags.  COMPLICATIONS: None.  FINDINGS: Ultrasound confirms moderate to moderately severe hydronephrosis bilaterally. Bilateral percutaneous nephrostomy tubes were able to be successfully placed with both catheters formed at the level of the renal pelvis. The collecting systems are partially duplicated bilaterally. Both tubes are draining well after placement.  IMPRESSION: Placement of bilateral 10 French percutaneous nephrostomy tubes to treat obstructive uropathy. Ten French catheters were placed and formed in the renal pelvis bilaterally. These were connected to gravity drainage bags. Urine output will be followed.   Electronically Signed   By: Aletta Edouard M.D.   On: 06/16/2014 17:21   Ir US Guide Bx Asp/drain  06/16/2014   CLINICAL DATA:  Acute renal failure with bilateral obstructive uropathy at the level of the bladder and distal ureters secondary to progressive prostate carcinoma. The patient requires bilateral percutaneous  nephrostomy tube placement.  EXAM: 1. ULTRASOUND GUIDANCE FOR PUNCTURE OF THE RIGHT RENAL COLLECTING SYSTEM. 2. RIGHT PERCUTANEOUS NEPHROSTOMY TUBE PLACEMENT. 3. ULTRASOUND GUIDANCE FOR PUNCTURE OF THE LEFT RENAL COLLECTING SYSTEM. 4. LEFT PERCUTANEOUS NEPHROSTOMY TUBE PLACEMENT.  COMPARISON:  CT OF THE ABDOMEN AND PELVIS ON 06/15/2014  ANESTHESIA/SEDATION: 2.0 mg IV Versed; 100 mcg IV Fentanyl.  Total Moderate Sedation Time  25 minutes  CONTRAST:  30 ml Omnipaque 300  MEDICATIONS: 400 mg IV Cipro. Ciprofloxacin was given within two hours of incision.  FLUOROSCOPY TIME:  2.0 minutes.  PROCEDURE: The procedure, risks, benefits, and alternatives were explained to the patient. Questions regarding the procedure were encouraged and answered. The patient understands and consents to the procedure.  Bilateral  flank regions were prepped with Betadine in a sterile fashion, and a sterile drape was applied covering the operative field. A sterile gown and sterile gloves were used for the procedure. Local anesthesia was provided with 1% Lidocaine. A time-out procedure was performed.  Ultrasound was used to localize both kidneys. Under direct ultrasound guidance, a 21 gauge needle was advanced into the right renal collecting system. Ultrasound image documentation was performed. Aspiration of urine sample was performed followed by contrast injection.  A transitional dilator was advanced over a guidewire. Percutaneous tract dilatation was then performed over the guidewire. A 10 French percutaneous nephrostomy tube was then advanced and formed in the collecting system. Catheter position was confirmed by fluoroscopy after contrast injection.  A 21 gauge needle was then advanced under ultrasound guidance into the left renal collecting system. Aspiration of urine was followed by contrast injection. A guidewire was advanced through the needle. A transitional dilator was placed. The tract was dilated and a 10 French nephrostomy tube advanced and formed in the collecting system. Catheter position confirmed by fluoroscopy after contrast injection.  Both catheters were secured at the skin with Prolene retention sutures and adhesive StatLock devices. Both were connected to gravity drainage bags.  COMPLICATIONS: None.  FINDINGS: Ultrasound confirms moderate to moderately severe hydronephrosis bilaterally. Bilateral percutaneous nephrostomy tubes were able to be successfully placed with both catheters formed at the level of the renal pelvis. The collecting systems are partially duplicated bilaterally. Both tubes are draining well after placement.  IMPRESSION: Placement of bilateral 10 French percutaneous nephrostomy tubes to treat obstructive uropathy. Ten French catheters were placed and formed in the renal pelvis bilaterally. These were  connected to gravity drainage bags. Urine output will be followed.   Electronically Signed   By: Aletta Edouard M.D.   On: 06/16/2014 17:21        Scheduled Meds: . sodium chloride   Intravenous Once  . bicalutamide  50 mg Oral Daily  . cefTRIAXone (ROCEPHIN)  IV  1 g Intravenous Q24H  . docusate sodium  100 mg Oral BID  . sodium chloride  3 mL Intravenous Q12H   Continuous Infusions: . sodium chloride 125 mL/hr at 06/17/14 0845    Active Problems:   Urinary obstruction   Acute renal failure   Hyperkalemia   UTI (lower urinary tract infection)    Time spent: 25 minutes.    Reality Dejonge, DO. [facility] Pager [phone]  If 7PM-7AM, please contact night-coverage [email or web address] Password [email or web address] 06/17/2014, 9:05 AM    LOS: 4 days

## 2014-06-18 DIAGNOSIS — D62 Acute posthemorrhagic anemia: Secondary | ICD-10-CM

## 2014-06-18 LAB — HEMOGLOBIN AND HEMATOCRIT, BLOOD
HCT: 21.5 % — ABNORMAL LOW (ref 39.0–52.0)
Hemoglobin: 7.4 g/dL — ABNORMAL LOW (ref 13.0–17.0)

## 2014-06-18 LAB — BASIC METABOLIC PANEL
Anion gap: 8 (ref 5–15)
BUN: 15 mg/dL (ref 6–23)
CO2: 18 mmol/L — ABNORMAL LOW (ref 19–32)
Calcium: 7.8 mg/dL — ABNORMAL LOW (ref 8.4–10.5)
Chloride: 111 mEq/L (ref 96–112)
Creatinine, Ser: 0.74 mg/dL (ref 0.50–1.35)
GFR calc non Af Amer: 82 mL/min — ABNORMAL LOW (ref >90)
Glucose, Bld: 94 mg/dL (ref 70–99)
Potassium: 4.1 mmol/L (ref 3.5–5.1)
Sodium: 137 mmol/L (ref 135–145)

## 2014-06-18 LAB — CBC
HEMATOCRIT: 21.6 % — AB (ref 39.0–52.0)
HEMOGLOBIN: 7.4 g/dL — AB (ref 13.0–17.0)
MCH: 29.2 pg (ref 26.0–34.0)
MCHC: 34.3 g/dL (ref 30.0–36.0)
MCV: 85.4 fL (ref 78.0–100.0)
Platelets: 171 10*3/uL (ref 150–400)
RBC: 2.53 MIL/uL — ABNORMAL LOW (ref 4.22–5.81)
RDW: 16.1 % — ABNORMAL HIGH (ref 11.5–15.5)
WBC: 14.5 10*3/uL — AB (ref 4.0–10.5)

## 2014-06-18 MED ORDER — POLYETHYLENE GLYCOL 3350 17 G PO PACK
17.0000 g | PACK | Freq: Every day | ORAL | Status: DC
  Administered 2014-06-18 – 2014-06-21 (×4): 17 g via ORAL
  Filled 2014-06-18 (×4): qty 1

## 2014-06-18 MED ORDER — BISACODYL 10 MG RE SUPP: 10.0000 mg | Freq: Every day | RECTAL | Status: DC | PRN

## 2014-06-18 NOTE — Progress Notes (Signed)
06/18/2014 1000 During morning assessment, rash noted on pt. Back and lower abdomen. Pt. States very itchy. Pt. Ordered prn kenalog cream. Applied to site. Laundry notified for hypoallergenic linen to be delivered to room. Pt. Updated on plan of care. Will continue to monitor patient.  Karsen Fellows, Arville Lime

## 2014-06-18 NOTE — Clinical Social Work Psychosocial (Signed)
Clinical Social Work Department BRIEF PSYCHOSOCIAL ASSESSMENT 06/18/2014  Patient:  [name] Ferrell, [name] Ferrell     Account Number:  [id number]     Admit date:  06/13/2014  Clinical Social Worker:  [name] Ferrell  Date/Time:  06/18/2014 04:13 PM  Referred by:  CSW  Date Referred:  06/18/2014 Referred for  Other - See comment   Other Referral:   Patient was explained if he feels he needs a SNF or if he is planning to return back home with home health.   Interview type:  Patient Other interview type:    PSYCHOSOCIAL DATA Living Status:  WIFE Admitted from facility:   Level of care:   Primary support name:  none Primary support relationship to patient:   Degree of support available:   Patient does not have a support network because his family does not live local and he is his wife's caregiver.  Patient's wife is currently at [facility] for short term rehab, while patient is healing.    CURRENT CONCERNS Current Concerns  Other - See comment   Other Concerns:   Discussion with patient about possiblly going to a SNF or going home with home health.    SOCIAL WORK ASSESSMENT / PLAN Patient is a 81 year old male who is alert and oriented x4. Patient is his wife's primary caregiver.  Patient's wife has some possible dementia, and weakness.  Patient is currently in the hospital, and the plan is to return back home once he is medically ready and discharge orders have been received.  Patient is open to the possiblity that he may need to go to a SNF, but would prefer home health.   Assessment/plan status:  Psychosocial Support/Ongoing Assessment of Needs Other assessment/ plan:   Information/referral to community resources:    PATIENT'S/FAMILY'S RESPONSE TO PLAN OF CARE: Patient is in agreement to possibly going to a SNF if PT recommends, but plan is to return back home with home health.  Patient may look at ALF once he has discharged for him and his wife.    [name] Ferrell.  [place], MSW, [place] 06/18/2014 4:19 PM

## 2014-06-18 NOTE — Progress Notes (Signed)
PROGRESS NOTE    ELIEL DUDDING QPY:[id number] DOB: 09-Nov-1927 DOA: 06/13/2014 PCP: [name], MD  Primary Urologist: Dr. Carolan Clines  HPI/Brief narrative 81 year old male patient with history of essential hypertension, recently seen in initial visit by urology for? Prostate cancer, presented to the ED with difficulty urinating and ankle swelling. In the ED, noted to be in urinary retention and past 2.5 L of dark brown urine following Foley placement and had abnormal labs: Creatinine 13.41 and potassium 6.5. He has had IR place nephrostomy tubes 12/30   Assessment/Plan:  Acute urinary retention with bilateral hydronephrosis: Likely secondary to urinary bladder base mass. Foley placed. Urology consultation appreciated.  IR consulted and nephrostomy tubes placed  Mass at urinary bladder base: DD-prostate cancer versus bladder cancer. Management per urology: Plan: 1. Immediate plan: a. Leave foley in place. Irrigate by hand with sterile water 60-120cc and toomey syringe prn clots.   b. Bone scan vs Na-Fl PET scan while in house looking for bone mets- negative  c. Begin casodex 3x/day in preparation for hormone therapy  2. Longer term plan: discharge from [facility]: Office prostate biopsy, possible hormone therapy to begin next week, and  Oncology consult.    Acute renal failure/hyperkalemia/anion gap metabolic acidosis: Secondary to obstructive uropathy from problem #1 and 2. Improving after Foley catheter placement. Metabolic acidosis and hyperkalemia resolved. Creatinine  improving    Urinary tract infection: UA was impressive. Urine culture is negative but would continue IV Rocephin given Foley catheter and still possible UTI.  Anemia: appears to be ABLA  transfusion if less than 7 g per DL. -s/p 2 units PRBC  Essential hypertension: Controlled/soft blood pressures. Not on  medications at this time.  leucoytosis -improved- ? reactive    Code Status: Full Family Communication: None at bedside Disposition Plan: Home when ok with urology   Consultants:  Urology  Procedures:  Foley catheter  Nephrostomy tubes  Antibiotics:  Rocephin IV  Subjective: Large amount of blood in foley bag  Objective: Filed Vitals:   06/17/14 1700 06/17/14 1745 06/17/14 2012 06/18/14 0339  BP: 108/38 139/59 119/37 115/57  Pulse: 90 98 91 85  Temp: 98.9 F (37.2 C) 99.4 F (37.4 C) 98.7 F (37.1 C) 99.1 F (37.3 C)  TempSrc: Oral Oral Oral Oral  Resp: 16 18  18   Height:      Weight:    92.4 kg (203 lb 11.3 oz)  SpO2: 100% 99% 99% 96%    Intake/Output Summary (Last 24 hours) at 06/18/14 0818 Last data filed at 06/18/14 0345  Gross per 24 hour  Intake    770 ml  Output   2350 ml  Net  -1580 ml   Filed Weights   06/16/14 0441 06/17/14 0434 06/18/14 0339  Weight: 93.4 kg (205 lb 14.6 oz) 94 kg (207 lb 3.7 oz) 92.4 kg (203 lb 11.3 oz)     Exam:  General exam: Pleasant elderly male lying comfortably in bed, a+Ox3, NAD Respiratory system: Clear. No increased work of breathing. Cardiovascular system: S1 & S2 heard, RRR. No JVD, murmurs, gallops, clicks or pedal edema. Telemetry: Sinus rhythm. Gastrointestinal system: Abdomen is nondistended, soft and nontender. Normal bowel sounds heard. Extremities: Symmetric 5 x 5 power.   Data Reviewed: Basic Metabolic Panel:  Recent Labs Lab 06/14/14 0909  06/15/14 0645 06/15/14 1655 06/16/14 0616 06/17/14 0254 06/18/14 0332  NA 139  < > 136 136 134* 136 137  K 4.6  < > 4.9 4.7 4.8 4.5  4.1  CL 109  < > 109 106 107 111 111  CO2 18*  < > 17* 21 16* 17* 18*  GLUCOSE 100*  < > 102* 104* 78 110* 94  BUN 98*  < > 89* 72* 71* 46* 15  CREATININE 6.71*  < > 5.63* 4.36* 4.77* 2.26* 0.74  CALCIUM 8.3*  < > 8.1* 8.0* 7.7* 7.9* 7.8*  MG 2.5  --   --   --   --   --   --   PHOS 4.3  --   --   --   --   --   --    < > = values in this interval not displayed. Liver Function Tests:  Recent Labs Lab 06/13/14 2121 06/14/14 0909  AST 16 13  ALT 18 17  ALKPHOS 88 74  BILITOT 1.6* 1.5*  PROT 6.4 5.7*  ALBUMIN 2.7* 2.3*   No results for input(s): LIPASE, AMYLASE in the last 168 hours. No results for input(s): AMMONIA in the last 168 hours. CBC:  Recent Labs Lab 06/13/14 2121  06/15/14 0645 06/15/14 1655 06/16/14 0616 06/17/14 0254 06/17/14 1015 06/18/14 0332  WBC 15.0*  < > 11.2* 12.2* 12.9* 18.9*  --  14.5*  NEUTROABS 12.8*  --   --   --   --   --   --   --   HGB 8.9*  < > 7.2* 6.9* 7.6* 6.6* 7.0* 7.4*  HCT 26.7*  < > 22.0* 20.9* 22.4* 19.8* 20.1* 21.6*  MCV 87.8  < > 89.1 89.7 89.6 90.0  --  85.4  PLT 345  < > 262 241 204 207  --  171  < > = values in this interval not displayed. Cardiac Enzymes: No results for input(s): CKTOTAL, CKMB, CKMBINDEX, TROPONINI in the last 168 hours. BNP (last 3 results) No results for input(s): PROBNP in the last 8760 hours. CBG:  Recent Labs Lab 06/15/14 0817 06/15/14 1206 06/15/14 1628  GLUCAP 100* 99 95    Recent Results (from the past 240 hour(s))  Urine culture     Status: None   Collection Time: 06/13/14 10:00 PM  Result Value Ref Range Status   Specimen Description URINE, CATHETERIZED  Final   Special Requests NONE  Final   Colony Count NO GROWTH Performed at [facility]   Final   Culture NO GROWTH Performed at [facility]   Final   Report Status 06/15/2014 FINAL  Final         Studies: Nm Bone Scan Whole Body  06/17/2014   CLINICAL DATA:  Prostate malignancy. No additional history available  EXAM: NUCLEAR MEDICINE WHOLE BODY BONE SCAN  TECHNIQUE: Whole body anterior and posterior images were obtained approximately 3 hours after intravenous injection of radiopharmaceutical.  RADIOPHARMACEUTICALS:  25 MCi Technetium-99 MDP  COMPARISON:  None  FINDINGS: There is adequate uptake of the radiopharmaceutical by  the skeleton. Adequate soft tissue clearance and renal activity is demonstrated. There is some slowing of radio labeled urine over the left knee.  Uptake of the radiotracer over the calvarium and cervical spine is normal. There are foci of minimally increased uptake posteriorly at T7 and T10. There is mildly increased uptake at L1 and L2 demonstrated on the anterior images. Uptake over the pelvis is normal. There is mildly increased uptake about the ankles and mid feet most compatible with degenerative change. Uptake over the pectoral girdles is normal.  IMPRESSION: There are no findings highly suspicious for  metastatic disease. Mildly increased uptake in the thoracic and lumbar spines is likely degenerative in nature. Plain films of the thoracic and lumbar spine would be useful.   Electronically Signed   By: David  [place]   On: 06/17/2014 15:43   Ir Perc Nephrostomy Left  06/16/2014   CLINICAL DATA:  Acute renal failure with bilateral obstructive uropathy at the level of the bladder and distal ureters secondary to progressive prostate carcinoma. The patient requires bilateral percutaneous nephrostomy tube placement.  EXAM: 1. ULTRASOUND GUIDANCE FOR PUNCTURE OF THE RIGHT RENAL COLLECTING SYSTEM. 2. RIGHT PERCUTANEOUS NEPHROSTOMY TUBE PLACEMENT. 3. ULTRASOUND GUIDANCE FOR PUNCTURE OF THE LEFT RENAL COLLECTING SYSTEM. 4. LEFT PERCUTANEOUS NEPHROSTOMY TUBE PLACEMENT.  COMPARISON:  CT OF THE ABDOMEN AND PELVIS ON 06/15/2014  ANESTHESIA/SEDATION: 2.0 mg IV Versed; 100 mcg IV Fentanyl.  Total Moderate Sedation Time  25 minutes  CONTRAST:  30 ml Omnipaque 300  MEDICATIONS: 400 mg IV Cipro. Ciprofloxacin was given within two hours of incision.  FLUOROSCOPY TIME:  2.0 minutes.  PROCEDURE: The procedure, risks, benefits, and alternatives were explained to the patient. Questions regarding the procedure were encouraged and answered. The patient understands and consents to the procedure.  Bilateral flank regions were  prepped with Betadine in a sterile fashion, and a sterile drape was applied covering the operative field. A sterile gown and sterile gloves were used for the procedure. Local anesthesia was provided with 1% Lidocaine. A time-out procedure was performed.  Ultrasound was used to localize both kidneys. Under direct ultrasound guidance, a 21 gauge needle was advanced into the right renal collecting system. Ultrasound image documentation was performed. Aspiration of urine sample was performed followed by contrast injection.  A transitional dilator was advanced over a guidewire. Percutaneous tract dilatation was then performed over the guidewire. A 10 French percutaneous nephrostomy tube was then advanced and formed in the collecting system. Catheter position was confirmed by fluoroscopy after contrast injection.  A 21 gauge needle was then advanced under ultrasound guidance into the left renal collecting system. Aspiration of urine was followed by contrast injection. A guidewire was advanced through the needle. A transitional dilator was placed. The tract was dilated and a 10 French nephrostomy tube advanced and formed in the collecting system. Catheter position confirmed by fluoroscopy after contrast injection.  Both catheters were secured at the skin with Prolene retention sutures and adhesive StatLock devices. Both were connected to gravity drainage bags.  COMPLICATIONS: None.  FINDINGS: Ultrasound confirms moderate to moderately severe hydronephrosis bilaterally. Bilateral percutaneous nephrostomy tubes were able to be successfully placed with both catheters formed at the level of the renal pelvis. The collecting systems are partially duplicated bilaterally. Both tubes are draining well after placement.  IMPRESSION: Placement of bilateral 10 French percutaneous nephrostomy tubes to treat obstructive uropathy. Ten French catheters were placed and formed in the renal pelvis bilaterally. These were connected to gravity  drainage bags. Urine output will be followed.   Electronically Signed   By: Aletta Edouard M.D.   On: 06/16/2014 17:21   Ir Perc Nephrostomy Right  06/16/2014   CLINICAL DATA:  Acute renal failure with bilateral obstructive uropathy at the level of the bladder and distal ureters secondary to progressive prostate carcinoma. The patient requires bilateral percutaneous nephrostomy tube placement.  EXAM: 1. ULTRASOUND GUIDANCE FOR PUNCTURE OF THE RIGHT RENAL COLLECTING SYSTEM. 2. RIGHT PERCUTANEOUS NEPHROSTOMY TUBE PLACEMENT. 3. ULTRASOUND GUIDANCE FOR PUNCTURE OF THE LEFT RENAL COLLECTING SYSTEM. 4. LEFT PERCUTANEOUS NEPHROSTOMY TUBE PLACEMENT.  COMPARISON:  CT OF THE ABDOMEN AND PELVIS ON 06/15/2014  ANESTHESIA/SEDATION: 2.0 mg IV Versed; 100 mcg IV Fentanyl.  Total Moderate Sedation Time  25 minutes  CONTRAST:  30 ml Omnipaque 300  MEDICATIONS: 400 mg IV Cipro. Ciprofloxacin was given within two hours of incision.  FLUOROSCOPY TIME:  2.0 minutes.  PROCEDURE: The procedure, risks, benefits, and alternatives were explained to the patient. Questions regarding the procedure were encouraged and answered. The patient understands and consents to the procedure.  Bilateral flank regions were prepped with Betadine in a sterile fashion, and a sterile drape was applied covering the operative field. A sterile gown and sterile gloves were used for the procedure. Local anesthesia was provided with 1% Lidocaine. A time-out procedure was performed.  Ultrasound was used to localize both kidneys. Under direct ultrasound guidance, a 21 gauge needle was advanced into the right renal collecting system. Ultrasound image documentation was performed. Aspiration of urine sample was performed followed by contrast injection.  A transitional dilator was advanced over a guidewire. Percutaneous tract dilatation was then performed over the guidewire. A 10 French percutaneous nephrostomy tube was then advanced and formed in the collecting  system. Catheter position was confirmed by fluoroscopy after contrast injection.  A 21 gauge needle was then advanced under ultrasound guidance into the left renal collecting system. Aspiration of urine was followed by contrast injection. A guidewire was advanced through the needle. A transitional dilator was placed. The tract was dilated and a 10 French nephrostomy tube advanced and formed in the collecting system. Catheter position confirmed by fluoroscopy after contrast injection.  Both catheters were secured at the skin with Prolene retention sutures and adhesive StatLock devices. Both were connected to gravity drainage bags.  COMPLICATIONS: None.  FINDINGS: Ultrasound confirms moderate to moderately severe hydronephrosis bilaterally. Bilateral percutaneous nephrostomy tubes were able to be successfully placed with both catheters formed at the level of the renal pelvis. The collecting systems are partially duplicated bilaterally. Both tubes are draining well after placement.  IMPRESSION: Placement of bilateral 10 French percutaneous nephrostomy tubes to treat obstructive uropathy. Ten French catheters were placed and formed in the renal pelvis bilaterally. These were connected to gravity drainage bags. Urine output will be followed.   Electronically Signed   By: Aletta Edouard M.D.   On: 06/16/2014 17:21   Ir US Guide Bx Asp/drain  06/16/2014   CLINICAL DATA:  Acute renal failure with bilateral obstructive uropathy at the level of the bladder and distal ureters secondary to progressive prostate carcinoma. The patient requires bilateral percutaneous nephrostomy tube placement.  EXAM: 1. ULTRASOUND GUIDANCE FOR PUNCTURE OF THE RIGHT RENAL COLLECTING SYSTEM. 2. RIGHT PERCUTANEOUS NEPHROSTOMY TUBE PLACEMENT. 3. ULTRASOUND GUIDANCE FOR PUNCTURE OF THE LEFT RENAL COLLECTING SYSTEM. 4. LEFT PERCUTANEOUS NEPHROSTOMY TUBE PLACEMENT.  COMPARISON:  CT OF THE ABDOMEN AND PELVIS ON 06/15/2014  ANESTHESIA/SEDATION: 2.0  mg IV Versed; 100 mcg IV Fentanyl.  Total Moderate Sedation Time  25 minutes  CONTRAST:  30 ml Omnipaque 300  MEDICATIONS: 400 mg IV Cipro. Ciprofloxacin was given within two hours of incision.  FLUOROSCOPY TIME:  2.0 minutes.  PROCEDURE: The procedure, risks, benefits, and alternatives were explained to the patient. Questions regarding the procedure were encouraged and answered. The patient understands and consents to the procedure.  Bilateral flank regions were prepped with Betadine in a sterile fashion, and a sterile drape was applied covering the operative field. A sterile gown and sterile gloves were used for the procedure. Local anesthesia was provided with 1%  Lidocaine. A time-out procedure was performed.  Ultrasound was used to localize both kidneys. Under direct ultrasound guidance, a 21 gauge needle was advanced into the right renal collecting system. Ultrasound image documentation was performed. Aspiration of urine sample was performed followed by contrast injection.  A transitional dilator was advanced over a guidewire. Percutaneous tract dilatation was then performed over the guidewire. A 10 French percutaneous nephrostomy tube was then advanced and formed in the collecting system. Catheter position was confirmed by fluoroscopy after contrast injection.  A 21 gauge needle was then advanced under ultrasound guidance into the left renal collecting system. Aspiration of urine was followed by contrast injection. A guidewire was advanced through the needle. A transitional dilator was placed. The tract was dilated and a 10 French nephrostomy tube advanced and formed in the collecting system. Catheter position confirmed by fluoroscopy after contrast injection.  Both catheters were secured at the skin with Prolene retention sutures and adhesive StatLock devices. Both were connected to gravity drainage bags.  COMPLICATIONS: None.  FINDINGS: Ultrasound confirms moderate to moderately severe hydronephrosis  bilaterally. Bilateral percutaneous nephrostomy tubes were able to be successfully placed with both catheters formed at the level of the renal pelvis. The collecting systems are partially duplicated bilaterally. Both tubes are draining well after placement.  IMPRESSION: Placement of bilateral 10 French percutaneous nephrostomy tubes to treat obstructive uropathy. Ten French catheters were placed and formed in the renal pelvis bilaterally. These were connected to gravity drainage bags. Urine output will be followed.   Electronically Signed   By: Aletta Edouard M.D.   On: 06/16/2014 17:21   Ir US Guide Bx Asp/drain  06/16/2014   CLINICAL DATA:  Acute renal failure with bilateral obstructive uropathy at the level of the bladder and distal ureters secondary to progressive prostate carcinoma. The patient requires bilateral percutaneous nephrostomy tube placement.  EXAM: 1. ULTRASOUND GUIDANCE FOR PUNCTURE OF THE RIGHT RENAL COLLECTING SYSTEM. 2. RIGHT PERCUTANEOUS NEPHROSTOMY TUBE PLACEMENT. 3. ULTRASOUND GUIDANCE FOR PUNCTURE OF THE LEFT RENAL COLLECTING SYSTEM. 4. LEFT PERCUTANEOUS NEPHROSTOMY TUBE PLACEMENT.  COMPARISON:  CT OF THE ABDOMEN AND PELVIS ON 06/15/2014  ANESTHESIA/SEDATION: 2.0 mg IV Versed; 100 mcg IV Fentanyl.  Total Moderate Sedation Time  25 minutes  CONTRAST:  30 ml Omnipaque 300  MEDICATIONS: 400 mg IV Cipro. Ciprofloxacin was given within two hours of incision.  FLUOROSCOPY TIME:  2.0 minutes.  PROCEDURE: The procedure, risks, benefits, and alternatives were explained to the patient. Questions regarding the procedure were encouraged and answered. The patient understands and consents to the procedure.  Bilateral flank regions were prepped with Betadine in a sterile fashion, and a sterile drape was applied covering the operative field. A sterile gown and sterile gloves were used for the procedure. Local anesthesia was provided with 1% Lidocaine. A time-out procedure was performed.  Ultrasound was  used to localize both kidneys. Under direct ultrasound guidance, a 21 gauge needle was advanced into the right renal collecting system. Ultrasound image documentation was performed. Aspiration of urine sample was performed followed by contrast injection.  A transitional dilator was advanced over a guidewire. Percutaneous tract dilatation was then performed over the guidewire. A 10 French percutaneous nephrostomy tube was then advanced and formed in the collecting system. Catheter position was confirmed by fluoroscopy after contrast injection.  A 21 gauge needle was then advanced under ultrasound guidance into the left renal collecting system. Aspiration of urine was followed by contrast injection. A guidewire was advanced through the needle. A transitional  dilator was placed. The tract was dilated and a 10 French nephrostomy tube advanced and formed in the collecting system. Catheter position confirmed by fluoroscopy after contrast injection.  Both catheters were secured at the skin with Prolene retention sutures and adhesive StatLock devices. Both were connected to gravity drainage bags.  COMPLICATIONS: None.  FINDINGS: Ultrasound confirms moderate to moderately severe hydronephrosis bilaterally. Bilateral percutaneous nephrostomy tubes were able to be successfully placed with both catheters formed at the level of the renal pelvis. The collecting systems are partially duplicated bilaterally. Both tubes are draining well after placement.  IMPRESSION: Placement of bilateral 10 French percutaneous nephrostomy tubes to treat obstructive uropathy. Ten French catheters were placed and formed in the renal pelvis bilaterally. These were connected to gravity drainage bags. Urine output will be followed.   Electronically Signed   By: Aletta Edouard M.D.   On: 06/16/2014 17:21        Scheduled Meds: . bicalutamide  50 mg Oral Daily  . cefTRIAXone (ROCEPHIN)  IV  1 g Intravenous Q24H  . docusate sodium  100 mg Oral  BID  . polyethylene glycol  17 g Oral Daily  . sodium chloride  3 mL Intravenous Q12H   Continuous Infusions:    Active Problems:   Urinary obstruction   Acute renal failure   Hyperkalemia   UTI (lower urinary tract infection)    Time spent: 25 minutes.    VANN, JESSICA, DO. [facility] Pager [phone]  If 7PM-7AM, please contact night-coverage [email or web address] Password [email or web address] 06/18/2014, 8:18 AM    LOS: 5 days

## 2014-06-18 NOTE — Clinical Social Work Note (Signed)
Discussed with patient if his plan is to still return back home once he is medically ready and discharge orders have been received.  Patient stated that is his plan, because he needs to continue to take care of his wife.  Patient stated it is becoming too much to continue to help take care of her so him and his family are talking about looking into ALF or a SNF for long term for his wife.  Patient was talkative, and positive, patient states he is aware that his wife is at [facility] for some short term rehab.  Patient states he and his wife may look at moving to [place] to be closer to their daughter once him and his wife have recovered.  CSW will continue to follow in case patient does need to go to SNF for short term rehab.  [name]. [place], MSW, [place] 06/18/2014 4:32 PM

## 2014-06-18 NOTE — Progress Notes (Signed)
06/18/2014 4:00 PM Nursing note Pt. Offered prn suppository for constipation. Pt. Refusing at this time. Will contact RN if he decides to proceed.  Anabella Capshaw, Arville Lime

## 2014-06-18 NOTE — Progress Notes (Signed)
Subjective: Patient reports that he's feeling fine. He's had no problems with his percutaneous tubes.  Objective: Vital signs in last 24 hours: Temp:  [98.3 F (36.8 C)-99.4 F (37.4 C)] 98.3 F (36.8 C) (01/01 1333) Pulse Rate:  [85-98] 89 (01/01 1333) Resp:  [16-18] 18 (01/01 1333) BP: (108-139)/(37-66) 128/66 mmHg (01/01 1333) SpO2:  [96 %-100 %] 98 % (01/01 1333) Weight:  [92.4 kg (203 lb 11.3 oz)] 92.4 kg (203 lb 11.3 oz) (01/01 0339)  Intake/Output from previous day: 12/31 0701 - 01/01 0700 In: 770 [P.O.:120; I.V.:250; Blood:400] Out: 2925 [Urine:2925] Intake/Output this shift: Total I/O In: 120 [P.O.:120] Out: 425 [Urine:425]  Physical Exam:  Constitutional: Vital signs reviewed. WD WN in NAD   Eyes: PERRL, No scleral icterus.    There is some bloody urine in the urethral catheter. This has been irrigated.  Lab Results:  Recent Labs  06/17/14 0254 06/17/14 1015 06/18/14 0332  HGB 6.6* 7.0* 7.4*  HCT 19.8* 20.1* 21.6*   BMET  Recent Labs  06/17/14 0254 06/18/14 0332  NA 136 137  K 4.5 4.1  CL 111 111  CO2 17* 18*  GLUCOSE 110* 94  BUN 46* 15  CREATININE 2.26* 0.74  CALCIUM 7.9* 7.8*    Recent Labs  06/16/14 1144  INR 1.64*   No results for input(s): LABURIN in the last 72 hours. Results for orders placed or performed during the hospital encounter of 06/13/14  Urine culture     Status: None   Collection Time: 06/13/14 10:00 PM  Result Value Ref Range Status   Specimen Description URINE, CATHETERIZED  Final   Special Requests NONE  Final   Colony Count NO GROWTH Performed at [facility]   Final   Culture NO GROWTH Performed at [facility]   Final   Report Status 06/15/2014 FINAL  Final    Studies/Results: Nm Bone Scan Whole Body  06/17/2014   CLINICAL DATA:  Prostate malignancy. No additional history available  EXAM: NUCLEAR MEDICINE WHOLE BODY BONE SCAN  TECHNIQUE: Whole body anterior and posterior images were  obtained approximately 3 hours after intravenous injection of radiopharmaceutical.  RADIOPHARMACEUTICALS:  25 MCi Technetium-99 MDP  COMPARISON:  None  FINDINGS: There is adequate uptake of the radiopharmaceutical by the skeleton. Adequate soft tissue clearance and renal activity is demonstrated. There is some slowing of radio labeled urine over the left knee.  Uptake of the radiotracer over the calvarium and cervical spine is normal. There are foci of minimally increased uptake posteriorly at T7 and T10. There is mildly increased uptake at L1 and L2 demonstrated on the anterior images. Uptake over the pelvis is normal. There is mildly increased uptake about the ankles and mid feet most compatible with degenerative change. Uptake over the pectoral girdles is normal.  IMPRESSION: There are no findings highly suspicious for metastatic disease. Mildly increased uptake in the thoracic and lumbar spines is likely degenerative in nature. Plain films of the thoracic and lumbar spine would be useful.   Electronically Signed   By: David  Martinique   On: 06/17/2014 15:43   Ir Perc Nephrostomy Left  06/16/2014   CLINICAL DATA:  Acute renal failure with bilateral obstructive uropathy at the level of the bladder and distal ureters secondary to progressive prostate carcinoma. The patient requires bilateral percutaneous nephrostomy tube placement.  EXAM: 1. ULTRASOUND GUIDANCE FOR PUNCTURE OF THE RIGHT RENAL COLLECTING SYSTEM. 2. RIGHT PERCUTANEOUS NEPHROSTOMY TUBE PLACEMENT. 3. ULTRASOUND GUIDANCE FOR PUNCTURE OF THE LEFT  RENAL COLLECTING SYSTEM. 4. LEFT PERCUTANEOUS NEPHROSTOMY TUBE PLACEMENT.  COMPARISON:  CT OF THE ABDOMEN AND PELVIS ON 06/15/2014  ANESTHESIA/SEDATION: 2.0 mg IV Versed; 100 mcg IV Fentanyl.  Total Moderate Sedation Time  25 minutes  CONTRAST:  30 ml Omnipaque 300  MEDICATIONS: 400 mg IV Cipro. Ciprofloxacin was given within two hours of incision.  FLUOROSCOPY TIME:  2.0 minutes.  PROCEDURE: The procedure,  risks, benefits, and alternatives were explained to the patient. Questions regarding the procedure were encouraged and answered. The patient understands and consents to the procedure.  Bilateral flank regions were prepped with Betadine in a sterile fashion, and a sterile drape was applied covering the operative field. A sterile gown and sterile gloves were used for the procedure. Local anesthesia was provided with 1% Lidocaine. A time-out procedure was performed.  Ultrasound was used to localize both kidneys. Under direct ultrasound guidance, a 21 gauge needle was advanced into the right renal collecting system. Ultrasound image documentation was performed. Aspiration of urine sample was performed followed by contrast injection.  A transitional dilator was advanced over a guidewire. Percutaneous tract dilatation was then performed over the guidewire. A 10 French percutaneous nephrostomy tube was then advanced and formed in the collecting system. Catheter position was confirmed by fluoroscopy after contrast injection.  A 21 gauge needle was then advanced under ultrasound guidance into the left renal collecting system. Aspiration of urine was followed by contrast injection. A guidewire was advanced through the needle. A transitional dilator was placed. The tract was dilated and a 10 French nephrostomy tube advanced and formed in the collecting system. Catheter position confirmed by fluoroscopy after contrast injection.  Both catheters were secured at the skin with Prolene retention sutures and adhesive StatLock devices. Both were connected to gravity drainage bags.  COMPLICATIONS: None.  FINDINGS: Ultrasound confirms moderate to moderately severe hydronephrosis bilaterally. Bilateral percutaneous nephrostomy tubes were able to be successfully placed with both catheters formed at the level of the renal pelvis. The collecting systems are partially duplicated bilaterally. Both tubes are draining well after placement.   IMPRESSION: Placement of bilateral 10 French percutaneous nephrostomy tubes to treat obstructive uropathy. Ten French catheters were placed and formed in the renal pelvis bilaterally. These were connected to gravity drainage bags. Urine output will be followed.   Electronically Signed   By: Aletta Edouard M.D.   On: 06/16/2014 17:21   Ir Perc Nephrostomy Right  06/16/2014   CLINICAL DATA:  Acute renal failure with bilateral obstructive uropathy at the level of the bladder and distal ureters secondary to progressive prostate carcinoma. The patient requires bilateral percutaneous nephrostomy tube placement.  EXAM: 1. ULTRASOUND GUIDANCE FOR PUNCTURE OF THE RIGHT RENAL COLLECTING SYSTEM. 2. RIGHT PERCUTANEOUS NEPHROSTOMY TUBE PLACEMENT. 3. ULTRASOUND GUIDANCE FOR PUNCTURE OF THE LEFT RENAL COLLECTING SYSTEM. 4. LEFT PERCUTANEOUS NEPHROSTOMY TUBE PLACEMENT.  COMPARISON:  CT OF THE ABDOMEN AND PELVIS ON 06/15/2014  ANESTHESIA/SEDATION: 2.0 mg IV Versed; 100 mcg IV Fentanyl.  Total Moderate Sedation Time  25 minutes  CONTRAST:  30 ml Omnipaque 300  MEDICATIONS: 400 mg IV Cipro. Ciprofloxacin was given within two hours of incision.  FLUOROSCOPY TIME:  2.0 minutes.  PROCEDURE: The procedure, risks, benefits, and alternatives were explained to the patient. Questions regarding the procedure were encouraged and answered. The patient understands and consents to the procedure.  Bilateral flank regions were prepped with Betadine in a sterile fashion, and a sterile drape was applied covering the operative field. A sterile gown and sterile gloves  were used for the procedure. Local anesthesia was provided with 1% Lidocaine. A time-out procedure was performed.  Ultrasound was used to localize both kidneys. Under direct ultrasound guidance, a 21 gauge needle was advanced into the right renal collecting system. Ultrasound image documentation was performed. Aspiration of urine sample was performed followed by contrast injection.  A  transitional dilator was advanced over a guidewire. Percutaneous tract dilatation was then performed over the guidewire. A 10 French percutaneous nephrostomy tube was then advanced and formed in the collecting system. Catheter position was confirmed by fluoroscopy after contrast injection.  A 21 gauge needle was then advanced under ultrasound guidance into the left renal collecting system. Aspiration of urine was followed by contrast injection. A guidewire was advanced through the needle. A transitional dilator was placed. The tract was dilated and a 10 French nephrostomy tube advanced and formed in the collecting system. Catheter position confirmed by fluoroscopy after contrast injection.  Both catheters were secured at the skin with Prolene retention sutures and adhesive StatLock devices. Both were connected to gravity drainage bags.  COMPLICATIONS: None.  FINDINGS: Ultrasound confirms moderate to moderately severe hydronephrosis bilaterally. Bilateral percutaneous nephrostomy tubes were able to be successfully placed with both catheters formed at the level of the renal pelvis. The collecting systems are partially duplicated bilaterally. Both tubes are draining well after placement.  IMPRESSION: Placement of bilateral 10 French percutaneous nephrostomy tubes to treat obstructive uropathy. Ten French catheters were placed and formed in the renal pelvis bilaterally. These were connected to gravity drainage bags. Urine output will be followed.   Electronically Signed   By: Aletta Edouard M.D.   On: 06/16/2014 17:21   Ir US Guide Bx Asp/drain  06/16/2014   CLINICAL DATA:  Acute renal failure with bilateral obstructive uropathy at the level of the bladder and distal ureters secondary to progressive prostate carcinoma. The patient requires bilateral percutaneous nephrostomy tube placement.  EXAM: 1. ULTRASOUND GUIDANCE FOR PUNCTURE OF THE RIGHT RENAL COLLECTING SYSTEM. 2. RIGHT PERCUTANEOUS NEPHROSTOMY TUBE  PLACEMENT. 3. ULTRASOUND GUIDANCE FOR PUNCTURE OF THE LEFT RENAL COLLECTING SYSTEM. 4. LEFT PERCUTANEOUS NEPHROSTOMY TUBE PLACEMENT.  COMPARISON:  CT OF THE ABDOMEN AND PELVIS ON 06/15/2014  ANESTHESIA/SEDATION: 2.0 mg IV Versed; 100 mcg IV Fentanyl.  Total Moderate Sedation Time  25 minutes  CONTRAST:  30 ml Omnipaque 300  MEDICATIONS: 400 mg IV Cipro. Ciprofloxacin was given within two hours of incision.  FLUOROSCOPY TIME:  2.0 minutes.  PROCEDURE: The procedure, risks, benefits, and alternatives were explained to the patient. Questions regarding the procedure were encouraged and answered. The patient understands and consents to the procedure.  Bilateral flank regions were prepped with Betadine in a sterile fashion, and a sterile drape was applied covering the operative field. A sterile gown and sterile gloves were used for the procedure. Local anesthesia was provided with 1% Lidocaine. A time-out procedure was performed.  Ultrasound was used to localize both kidneys. Under direct ultrasound guidance, a 21 gauge needle was advanced into the right renal collecting system. Ultrasound image documentation was performed. Aspiration of urine sample was performed followed by contrast injection.  A transitional dilator was advanced over a guidewire. Percutaneous tract dilatation was then performed over the guidewire. A 10 French percutaneous nephrostomy tube was then advanced and formed in the collecting system. Catheter position was confirmed by fluoroscopy after contrast injection.  A 21 gauge needle was then advanced under ultrasound guidance into the left renal collecting system. Aspiration of urine was followed by  contrast injection. A guidewire was advanced through the needle. A transitional dilator was placed. The tract was dilated and a 10 French nephrostomy tube advanced and formed in the collecting system. Catheter position confirmed by fluoroscopy after contrast injection.  Both catheters were secured at the  skin with Prolene retention sutures and adhesive StatLock devices. Both were connected to gravity drainage bags.  COMPLICATIONS: None.  FINDINGS: Ultrasound confirms moderate to moderately severe hydronephrosis bilaterally. Bilateral percutaneous nephrostomy tubes were able to be successfully placed with both catheters formed at the level of the renal pelvis. The collecting systems are partially duplicated bilaterally. Both tubes are draining well after placement.  IMPRESSION: Placement of bilateral 10 French percutaneous nephrostomy tubes to treat obstructive uropathy. Ten French catheters were placed and formed in the renal pelvis bilaterally. These were connected to gravity drainage bags. Urine output will be followed.   Electronically Signed   By: Aletta Edouard M.D.   On: 06/16/2014 17:21   Ir US Guide Bx Asp/drain  06/16/2014   CLINICAL DATA:  Acute renal failure with bilateral obstructive uropathy at the level of the bladder and distal ureters secondary to progressive prostate carcinoma. The patient requires bilateral percutaneous nephrostomy tube placement.  EXAM: 1. ULTRASOUND GUIDANCE FOR PUNCTURE OF THE RIGHT RENAL COLLECTING SYSTEM. 2. RIGHT PERCUTANEOUS NEPHROSTOMY TUBE PLACEMENT. 3. ULTRASOUND GUIDANCE FOR PUNCTURE OF THE LEFT RENAL COLLECTING SYSTEM. 4. LEFT PERCUTANEOUS NEPHROSTOMY TUBE PLACEMENT.  COMPARISON:  CT OF THE ABDOMEN AND PELVIS ON 06/15/2014  ANESTHESIA/SEDATION: 2.0 mg IV Versed; 100 mcg IV Fentanyl.  Total Moderate Sedation Time  25 minutes  CONTRAST:  30 ml Omnipaque 300  MEDICATIONS: 400 mg IV Cipro. Ciprofloxacin was given within two hours of incision.  FLUOROSCOPY TIME:  2.0 minutes.  PROCEDURE: The procedure, risks, benefits, and alternatives were explained to the patient. Questions regarding the procedure were encouraged and answered. The patient understands and consents to the procedure.  Bilateral flank regions were prepped with Betadine in a sterile fashion, and a sterile  drape was applied covering the operative field. A sterile gown and sterile gloves were used for the procedure. Local anesthesia was provided with 1% Lidocaine. A time-out procedure was performed.  Ultrasound was used to localize both kidneys. Under direct ultrasound guidance, a 21 gauge needle was advanced into the right renal collecting system. Ultrasound image documentation was performed. Aspiration of urine sample was performed followed by contrast injection.  A transitional dilator was advanced over a guidewire. Percutaneous tract dilatation was then performed over the guidewire. A 10 French percutaneous nephrostomy tube was then advanced and formed in the collecting system. Catheter position was confirmed by fluoroscopy after contrast injection.  A 21 gauge needle was then advanced under ultrasound guidance into the left renal collecting system. Aspiration of urine was followed by contrast injection. A guidewire was advanced through the needle. A transitional dilator was placed. The tract was dilated and a 10 French nephrostomy tube advanced and formed in the collecting system. Catheter position confirmed by fluoroscopy after contrast injection.  Both catheters were secured at the skin with Prolene retention sutures and adhesive StatLock devices. Both were connected to gravity drainage bags.  COMPLICATIONS: None.  FINDINGS: Ultrasound confirms moderate to moderately severe hydronephrosis bilaterally. Bilateral percutaneous nephrostomy tubes were able to be successfully placed with both catheters formed at the level of the renal pelvis. The collecting systems are partially duplicated bilaterally. Both tubes are draining well after placement.  IMPRESSION: Placement of bilateral 10 French percutaneous nephrostomy tubes to  treat obstructive uropathy. Ten French catheters were placed and formed in the renal pelvis bilaterally. These were connected to gravity drainage bags. Urine output will be followed.    Electronically Signed   By: Aletta Edouard M.D.   On: 06/16/2014 17:21    Assessment/Plan:   Metastatic prostate cancer, with multiple metastatic sites, although there is no evidence of bone involvement based on latest bone scan.    He is currently improving, especially with regards to his renal insufficiency. He will no doubt need his nephrostomy tubes for some time. I have discussed this with the patient.    Dr. Gaynelle Arabian will follow-up appropriately.   LOS: 5 days   Franchot Gallo M 06/18/2014, 3:16 PM

## 2014-06-19 LAB — TYPE AND SCREEN
ABO/RH(D): B POS
ANTIBODY SCREEN: NEGATIVE
UNIT DIVISION: 0
UNIT DIVISION: 0
Unit division: 0
Unit division: 0

## 2014-06-19 LAB — BASIC METABOLIC PANEL
Anion gap: 7 (ref 5–15)
BUN: 11 mg/dL (ref 6–23)
CHLORIDE: 106 meq/L (ref 96–112)
CO2: 23 mmol/L (ref 19–32)
Calcium: 7.9 mg/dL — ABNORMAL LOW (ref 8.4–10.5)
Creatinine, Ser: 0.7 mg/dL (ref 0.50–1.35)
GFR calc non Af Amer: 84 mL/min — ABNORMAL LOW (ref >90)
Glucose, Bld: 88 mg/dL (ref 70–99)
POTASSIUM: 3.9 mmol/L (ref 3.5–5.1)
Sodium: 136 mmol/L (ref 135–145)

## 2014-06-19 LAB — CBC
HCT: 21.3 % — ABNORMAL LOW (ref 39.0–52.0)
Hemoglobin: 7.3 g/dL — ABNORMAL LOW (ref 13.0–17.0)
MCH: 29.4 pg (ref 26.0–34.0)
MCHC: 34.3 g/dL (ref 30.0–36.0)
MCV: 85.9 fL (ref 78.0–100.0)
PLATELETS: 180 10*3/uL (ref 150–400)
RBC: 2.48 MIL/uL — ABNORMAL LOW (ref 4.22–5.81)
RDW: 16 % — ABNORMAL HIGH (ref 11.5–15.5)
WBC: 13.9 10*3/uL — ABNORMAL HIGH (ref 4.0–10.5)

## 2014-06-19 LAB — PREPARE RBC (CROSSMATCH)

## 2014-06-19 MED ORDER — SODIUM CHLORIDE 0.9 % IV SOLN: Freq: Once | INTRAVENOUS | Status: AC

## 2014-06-19 NOTE — Progress Notes (Signed)
Pt refuses to walk at this time. Will try again later. Will continue to monitor.

## 2014-06-19 NOTE — Progress Notes (Signed)
PROGRESS NOTE    [name] Ferrell ZOX:[id number] DOB: 16-Mar-1928 DOA: 06/13/2014 PCP: [name], MD  Primary Urologist: Dr. Carolan Clines  HPI/Brief narrative 81 year old male patient with history of essential hypertension, recently seen in initial visit by urology for? Prostate cancer, presented to the ED with difficulty urinating and ankle swelling. In the ED, noted to be in urinary retention and past 2.5 L of dark brown urine following Foley placement and had abnormal labs: Creatinine 13.41 and potassium 6.5. He has had IR place nephrostomy tubes 12/30   Assessment/Plan:  Acute urinary retention with bilateral hydronephrosis: Likely secondary to urinary bladder base mass. Foley placed. Urology consultation appreciated.  IR consulted and nephrostomy tubes placed  Mass at urinary bladder base: DD-prostate cancer versus bladder cancer. Management per urology: Plan: 1. Immediate plan: a. Leave foley in place. Irrigate by hand with sterile water 60-120cc and toomey syringe prn clots.   b. Bone scan vs Na-Fl PET scan while in house looking for bone mets- negative  c. Begin casodex 3x/day in preparation for hormone therapy  2. Longer term plan: discharge from [facility]: Office prostate biopsy, possible hormone therapy to begin next week, and  Oncology consult.    Acute renal failure/hyperkalemia/anion gap metabolic acidosis: Secondary to obstructive uropathy from problem #1 and 2. Improving after Foley catheter placement. Metabolic acidosis and hyperkalemia resolved. Creatinine  improving    Urinary tract infection: UA was impressive. Urine culture is negative but would continue IV Rocephin given Foley catheter and still possible UTI.- treat for 7 days  Anemia: appears to be ABLA  transfusion if less than 7 g per DL. -s/p 3 units PRBC- last PRBC 1/2  Essential hypertension:  Controlled/soft blood pressures. Not on medications at this time.  leucoytosis -improved- ? reactive    Code Status: Full Family Communication: None at bedside Disposition Plan: Home when ok with urology- suspect Monday/tuesday   Consultants:  Urology  Procedures:  Foley catheter  Nephrostomy tubes  Antibiotics:  Rocephin IV  Subjective: atill with blood in foley bag Getting up out of bed  Objective: Filed Vitals:   06/18/14 0339 06/18/14 1333 06/18/14 2150 06/19/14 0516  BP: 115/57 128/66 110/41 121/59  Pulse: 85 89 91 87  Temp: 99.1 F (37.3 C) 98.3 F (36.8 C) 98.4 F (36.9 C) 98 F (36.7 C)  TempSrc: Oral Oral Oral Oral  Resp: 18 18 18 12   Height:      Weight: 92.4 kg (203 lb 11.3 oz)   93.4 kg (205 lb 14.6 oz)  SpO2: 96% 98% 98% 98%    Intake/Output Summary (Last 24 hours) at 06/19/14 1002 Last data filed at 06/19/14 0953  Gross per 24 hour  Intake    140 ml  Output   2025 ml  Net  -1885 ml   Filed Weights   06/17/14 0434 06/18/14 0339 06/19/14 0516  Weight: 94 kg (207 lb 3.7 oz) 92.4 kg (203 lb 11.3 oz) 93.4 kg (205 lb 14.6 oz)     Exam:  General exam: Pleasant elderly male lying comfortably in bed, a+Ox3, NAD Respiratory system: Clear. No increased work of breathing. Cardiovascular system: S1 & S2 heard, RRR. No JVD, murmurs, gallops, clicks or pedal edema. Telemetry: Sinus rhythm. Gastrointestinal system: Abdomen is nondistended, soft and nontender. Normal bowel sounds heard. Extremities: Symmetric 5 x 5 power.   Data Reviewed: Basic Metabolic Panel:  Recent Labs Lab 06/14/14 0909  06/15/14 1655 06/16/14 0616 06/17/14 0254 06/18/14 0332 06/19/14 0025  NA 139  < >  136 134* 136 137 136  K 4.6  < > 4.7 4.8 4.5 4.1 3.9  CL 109  < > 106 107 111 111 106  CO2 18*  < > 21 16* 17* 18* 23  GLUCOSE 100*  < > 104* 78 110* 94 88  BUN 98*  < > 72* 71* 46* 15 11  CREATININE 6.71*  < > 4.36* 4.77* 2.26* 0.74 0.70  CALCIUM 8.3*  < > 8.0*  7.7* 7.9* 7.8* 7.9*  MG 2.5  --   --   --   --   --   --   PHOS 4.3  --   --   --   --   --   --   < > = values in this interval not displayed. Liver Function Tests:  Recent Labs Lab 06/13/14 2121 06/14/14 0909  AST 16 13  ALT 18 17  ALKPHOS 88 74  BILITOT 1.6* 1.5*  PROT 6.4 5.7*  ALBUMIN 2.7* 2.3*   No results for input(s): LIPASE, AMYLASE in the last 168 hours. No results for input(s): AMMONIA in the last 168 hours. CBC:  Recent Labs Lab 06/13/14 2121  06/15/14 1655 06/16/14 0616 06/17/14 0254 06/17/14 1015 06/18/14 0332 06/18/14 1622 06/19/14 0025  WBC 15.0*  < > 12.2* 12.9* 18.9*  --  14.5*  --  13.9*  NEUTROABS 12.8*  --   --   --   --   --   --   --   --   HGB 8.9*  < > 6.9* 7.6* 6.6* 7.0* 7.4* 7.4* 7.3*  HCT 26.7*  < > 20.9* 22.4* 19.8* 20.1* 21.6* 21.5* 21.3*  MCV 87.8  < > 89.7 89.6 90.0  --  85.4  --  85.9  PLT 345  < > 241 204 207  --  171  --  180  < > = values in this interval not displayed. Cardiac Enzymes: No results for input(s): CKTOTAL, CKMB, CKMBINDEX, TROPONINI in the last 168 hours. BNP (last 3 results) No results for input(s): PROBNP in the last 8760 hours. CBG:  Recent Labs Lab 06/15/14 0817 06/15/14 1206 06/15/14 1628  GLUCAP 100* 99 95    Recent Results (from the past 240 hour(s))  Urine culture     Status: None   Collection Time: 06/13/14 10:00 PM  Result Value Ref Range Status   Specimen Description URINE, CATHETERIZED  Final   Special Requests NONE  Final   Colony Count NO GROWTH Performed at [facility]   Final   Culture NO GROWTH Performed at [facility]   Final   Report Status 06/15/2014 FINAL  Final         Studies: Nm Bone Scan Whole Body  06/17/2014   CLINICAL DATA:  Prostate malignancy. No additional history available  EXAM: NUCLEAR MEDICINE WHOLE BODY BONE SCAN  TECHNIQUE: Whole body anterior and posterior images were obtained approximately 3 hours after intravenous injection of  radiopharmaceutical.  RADIOPHARMACEUTICALS:  25 MCi Technetium-99 MDP  COMPARISON:  None  FINDINGS: There is adequate uptake of the radiopharmaceutical by the skeleton. Adequate soft tissue clearance and renal activity is demonstrated. There is some slowing of radio labeled urine over the left knee.  Uptake of the radiotracer over the calvarium and cervical spine is normal. There are foci of minimally increased uptake posteriorly at T7 and T10. There is mildly increased uptake at L1 and L2 demonstrated on the anterior images. Uptake over the pelvis is normal. There  is mildly increased uptake about the ankles and mid feet most compatible with degenerative change. Uptake over the pectoral girdles is normal.  IMPRESSION: There are no findings highly suspicious for metastatic disease. Mildly increased uptake in the thoracic and lumbar spines is likely degenerative in nature. Plain films of the thoracic and lumbar spine would be useful.   Electronically Signed   By: David  Martinique   On: 06/17/2014 15:43        Scheduled Meds: . bicalutamide  50 mg Oral Daily  . cefTRIAXone (ROCEPHIN)  IV  1 g Intravenous Q24H  . docusate sodium  100 mg Oral BID  . polyethylene glycol  17 g Oral Daily  . sodium chloride  3 mL Intravenous Q12H   Continuous Infusions:    Active Problems:   Urinary obstruction   Acute renal failure   Hyperkalemia   UTI (lower urinary tract infection)   Acute blood loss anemia    Time spent: 25 minutes.    Ulysse Siemen, DO. [facility] Pager [phone]  If 7PM-7AM, please contact night-coverage [email or web address] Password [email or web address] 06/19/2014, 10:02 AM    LOS: 6 days

## 2014-06-19 NOTE — Progress Notes (Signed)
Subjective: Pt feeling ok this am. S/p (B)PCN yesterday Some c/o soreness from drains, but better daily  Objective: Physical Exam: BP 121/59 mmHg  Pulse 87  Temp(Src) 98 F (36.7 C) (Oral)  Resp 12  Ht 5\' 11"  (1.803 m)  Wt 205 lb 14.6 oz (93.4 kg)  BMI 28.73 kg/m2  SpO2 98% (B)PCN drains intact, sites clean, dry, mildly tender. Drains each with good output, sl blood tinge  Labs: CBC  Recent Labs  06/18/14 0332 06/18/14 1622 06/19/14 0025  WBC 14.5*  --  13.9*  HGB 7.4* 7.4* 7.3*  HCT 21.6* 21.5* 21.3*  PLT 171  --  180   BMET  Recent Labs  06/18/14 0332 06/19/14 0025  NA 137 136  K 4.1 3.9  CL 111 106  CO2 18* 23  GLUCOSE 94 88  BUN 15 11  CREATININE 0.74 0.70  CALCIUM 7.8* 7.9*   LFT No results for input(s): PROT, ALBUMIN, AST, ALT, ALKPHOS, BILITOT, BILIDIR, IBILI, LIPASE in the last 72 hours. PT/INR  Recent Labs  06/16/14 1144  LABPROT 19.5*  INR 1.64*     Studies/Results: Nm Bone Scan Whole Body  06/17/2014   CLINICAL DATA:  Prostate malignancy. No additional history available  EXAM: NUCLEAR MEDICINE WHOLE BODY BONE SCAN  TECHNIQUE: Whole body anterior and posterior images were obtained approximately 3 hours after intravenous injection of radiopharmaceutical.  RADIOPHARMACEUTICALS:  25 MCi Technetium-99 MDP  COMPARISON:  None  FINDINGS: There is adequate uptake of the radiopharmaceutical by the skeleton. Adequate soft tissue clearance and renal activity is demonstrated. There is some slowing of radio labeled urine over the left knee.  Uptake of the radiotracer over the calvarium and cervical spine is normal. There are foci of minimally increased uptake posteriorly at T7 and T10. There is mildly increased uptake at L1 and L2 demonstrated on the anterior images. Uptake over the pelvis is normal. There is mildly increased uptake about the ankles and mid feet most compatible with degenerative change. Uptake over the pectoral girdles is normal.   IMPRESSION: There are no findings highly suspicious for metastatic disease. Mildly increased uptake in the thoracic and lumbar spines is likely degenerative in nature. Plain films of the thoracic and lumbar spine would be useful.   Electronically Signed   By: David  Martinique   On: 06/17/2014 15:43    Assessment/Plan: Bilateral hydronephrosis from obstructive uropathy/prostate cancer S/p (B)PCN Good tube function  Cr down to 0.70 IR following, Urology plans noted.    LOS: 6 days    Ascencion Dike PA-C 06/19/2014 10:14 AM

## 2014-06-19 NOTE — Progress Notes (Signed)
Pt refuses to walk at this time. Will try again later.

## 2014-06-20 LAB — CBC
HCT: 23.9 % — ABNORMAL LOW (ref 39.0–52.0)
Hemoglobin: 8.2 g/dL — ABNORMAL LOW (ref 13.0–17.0)
MCH: 29 pg (ref 26.0–34.0)
MCHC: 34.3 g/dL (ref 30.0–36.0)
MCV: 84.5 fL (ref 78.0–100.0)
Platelets: 221 10*3/uL (ref 150–400)
RBC: 2.83 MIL/uL — AB (ref 4.22–5.81)
RDW: 15.3 % (ref 11.5–15.5)
WBC: 16.5 10*3/uL — ABNORMAL HIGH (ref 4.0–10.5)

## 2014-06-20 LAB — TYPE AND SCREEN
ABO/RH(D): B POS
Antibody Screen: NEGATIVE
UNIT DIVISION: 0

## 2014-06-20 LAB — BASIC METABOLIC PANEL
ANION GAP: 7 (ref 5–15)
BUN: 13 mg/dL (ref 6–23)
CALCIUM: 8.1 mg/dL — AB (ref 8.4–10.5)
CHLORIDE: 103 meq/L (ref 96–112)
CO2: 22 mmol/L (ref 19–32)
CREATININE: 0.73 mg/dL (ref 0.50–1.35)
GFR calc non Af Amer: 83 mL/min — ABNORMAL LOW (ref >90)
GLUCOSE: 104 mg/dL — AB (ref 70–99)
Potassium: 4 mmol/L (ref 3.5–5.1)
SODIUM: 132 mmol/L — AB (ref 135–145)

## 2014-06-20 MED ORDER — DIPHENHYDRAMINE HCL 25 MG PO CAPS
25.0000 mg | ORAL_CAPSULE | Freq: Four times a day (QID) | ORAL | Status: DC | PRN
  Administered 2014-06-21: 25 mg via ORAL
  Filled 2014-06-20: qty 1

## 2014-06-20 NOTE — Progress Notes (Signed)
PROGRESS NOTE    [name] Ferrell CWC:[id number] DOB: 02-13-1928 DOA: 06/13/2014 PCP: [name], MD  Primary Urologist: Dr. Carolan Clines  HPI/Brief narrative 81 year old male patient with history of essential hypertension, recently seen in initial visit by urology for? Prostate cancer, presented to the ED with difficulty urinating and ankle swelling. In the ED, noted to be in urinary retention and past 2.5 L of dark brown urine following Foley placement and had abnormal labs: Creatinine 13.41 and potassium 6.5. He has had IR place nephrostomy tubes 12/30   Assessment/Plan:  Acute urinary retention with bilateral hydronephrosis: Likely secondary to urinary bladder base mass. Foley placed. Urology consultation appreciated.  IR consulted and nephrostomy tubes placed  Mass at urinary bladder base: DD-prostate cancer versus bladder cancer. Management per urology: Plan: 1. Immediate plan: a. Leave foley in place. Irrigate by hand with sterile water 60-120cc and toomey syringe prn clots.   b. Bone scan vs Na-Fl PET scan while in house looking for bone mets- negative  c. Begin casodex 3x/day in preparation for hormone therapy  2. Longer term plan: discharge from [facility]: Office prostate biopsy, possible hormone therapy to begin next week, and  Oncology consult.    Acute renal failure/hyperkalemia/anion gap metabolic acidosis: Secondary to obstructive uropathy from problem #1 and 2. Improving after Foley catheter placement. Metabolic acidosis and hyperkalemia resolved. Creatinine  improving    Urinary tract infection: UA was impressive. Urine culture is negative but would continue IV Rocephin given Foley catheter and still possible UTI.- treat for 7 days  Anemia: appears to be ABLA  transfusion if less than 7 g per DL. -s/p 3 units PRBC- last PRBC 1/2, monitor  Essential hypertension:  Controlled/soft blood pressures. Not on medications at this time.  leucoytosis -improved- ? reactive    Code Status: Full Family Communication: None at bedside Disposition Plan: Home when ok with urology- suspect Monday/tuesday if bleeding stops   Consultants:  Urology  Procedures:  Foley catheter  Nephrostomy tubes  Antibiotics:  Rocephin IV  Subjective: still with blood in foley bag Getting up out of bed- wants to go home after discharge if possible C/o pressure at end of penis   Objective: Filed Vitals:   06/19/14 1250 06/19/14 1305 06/19/14 1604 06/19/14 1957  BP: 110/55 139/63 110/54 121/62  Pulse: 96 108 100 103  Temp: 98 F (36.7 C) 98.1 F (36.7 C) 98.3 F (36.8 C) 98.3 F (36.8 C)  TempSrc: Oral Oral Oral Oral  Resp: 18 18 16 20   Height:      Weight:      SpO2: 100% 100% 95% 93%    Intake/Output Summary (Last 24 hours) at 06/20/14 0847 Last data filed at 06/20/14 0745  Gross per 24 hour  Intake   1811 ml  Output   2455 ml  Net   -644 ml   Filed Weights   06/17/14 0434 06/18/14 0339 06/19/14 0516  Weight: 94 kg (207 lb 3.7 oz) 92.4 kg (203 lb 11.3 oz) 93.4 kg (205 lb 14.6 oz)     Exam:  General exam: Pleasant elderly male lying comfortably in bed, a+Ox3, NAD Respiratory system: Clear. No increased work of breathing. Cardiovascular system: S1 & S2 heard, RRR. No JVD, murmurs, gallops, clicks or pedal edema. Telemetry: Sinus rhythm. Gastrointestinal system: Abdomen is nondistended, soft and nontender. Normal bowel sounds heard. Extremities: Symmetric 5 x 5 power. -blood in foley bag   Data Reviewed: Basic Metabolic Panel:  Recent Labs Lab 06/14/14 [id number]  06/16/14 7829 06/17/14 0254 06/18/14 0332 06/19/14 0025 06/20/14 0343  NA 139  < > 134* 136 137 136 132*  K 4.6  < > 4.8 4.5 4.1 3.9 4.0  CL 109  < > 107 111 111 106 103  CO2 18*  < > 16* 17* 18* 23 22  GLUCOSE 100*  < > 78 110* 94 88 104*  BUN 98*  < > 71* 46* 15 11 13    CREATININE 6.71*  < > 4.77* 2.26* 0.74 0.70 0.73  CALCIUM 8.3*  < > 7.7* 7.9* 7.8* 7.9* 8.1*  MG 2.5  --   --   --   --   --   --   PHOS 4.3  --   --   --   --   --   --   < > = values in this interval not displayed. Liver Function Tests:  Recent Labs Lab 06/13/14 2121 06/14/14 0909  AST 16 13  ALT 18 17  ALKPHOS 88 74  BILITOT 1.6* 1.5*  PROT 6.4 5.7*  ALBUMIN 2.7* 2.3*   No results for input(s): LIPASE, AMYLASE in the last 168 hours. No results for input(s): AMMONIA in the last 168 hours. CBC:  Recent Labs Lab 06/13/14 2121  06/16/14 0616 06/17/14 0254 06/17/14 1015 06/18/14 0332 06/18/14 1622 06/19/14 0025 06/20/14 0343  WBC 15.0*  < > 12.9* 18.9*  --  14.5*  --  13.9* 16.5*  NEUTROABS 12.8*  --   --   --   --   --   --   --   --   HGB 8.9*  < > 7.6* 6.6* 7.0* 7.4* 7.4* 7.3* 8.2*  HCT 26.7*  < > 22.4* 19.8* 20.1* 21.6* 21.5* 21.3* 23.9*  MCV 87.8  < > 89.6 90.0  --  85.4  --  85.9 84.5  PLT 345  < > 204 207  --  171  --  180 221  < > = values in this interval not displayed. Cardiac Enzymes: No results for input(s): CKTOTAL, CKMB, CKMBINDEX, TROPONINI in the last 168 hours. BNP (last 3 results) No results for input(s): PROBNP in the last 8760 hours. CBG:  Recent Labs Lab 06/15/14 0817 06/15/14 1206 06/15/14 1628  GLUCAP 100* 99 95    Recent Results (from the past 240 hour(s))  Urine culture     Status: None   Collection Time: 06/13/14 10:00 PM  Result Value Ref Range Status   Specimen Description URINE, CATHETERIZED  Final   Special Requests NONE  Final   Colony Count NO GROWTH Performed at [facility]   Final   Culture NO GROWTH Performed at [facility]   Final   Report Status 06/15/2014 FINAL  Final         Studies: No results found.      Scheduled Meds: . bicalutamide  50 mg Oral Daily  . cefTRIAXone (ROCEPHIN)  IV  1 g Intravenous Q24H  . docusate sodium  100 mg Oral BID  . polyethylene glycol  17 g Oral  Daily  . sodium chloride  3 mL Intravenous Q12H   Continuous Infusions:    Active Problems:   Urinary obstruction   Acute renal failure   Hyperkalemia   UTI (lower urinary tract infection)   Acute blood loss anemia    Time spent: 25 minutes.    VANN, JESSICA, DO. [facility] Pager [phone]  If 7PM-7AM, please contact night-coverage [email or web address] Password [facility] 06/20/2014, 8:47 AM  LOS: 7 days

## 2014-06-20 NOTE — Progress Notes (Signed)
Pt ambulated in hallway 300 ft with rolling walker on room air and tolerated activity fair. Pt needs lot of encouragement for ambulation. Will continue to monitor.

## 2014-06-20 NOTE — Progress Notes (Signed)
Pt refusing to walk at this time. States he is tired and wants to nap first. Will try again after lunch.

## 2014-06-20 NOTE — Progress Notes (Signed)
Spoke with pt re: his d/c plans.  Pt will be returning home with his son and daughter who will be in town until Sunday, 1/9.  Pt interested in [place] at d/c, if appropriate, and prefers [facility], as he has used them previously.  Pt's general questions re: Medicaid and his current insurance answered and pt referred back to DSS and [place] [employer] for answers to his specific questions.  Pt has concerns re: caring for his wife when she is d/c'ed from the [facility].  ALF placement for both pt and his wife discussed.  Emotional support offered.

## 2014-06-21 DIAGNOSIS — N131 Hydronephrosis with ureteral stricture, not elsewhere classified: Secondary | ICD-10-CM | POA: Insufficient documentation

## 2014-06-21 LAB — CBC
HCT: 24.3 % — ABNORMAL LOW (ref 39.0–52.0)
Hemoglobin: 8 g/dL — ABNORMAL LOW (ref 13.0–17.0)
MCH: 28.2 pg (ref 26.0–34.0)
MCHC: 32.9 g/dL (ref 30.0–36.0)
MCV: 85.6 fL (ref 78.0–100.0)
PLATELETS: 253 10*3/uL (ref 150–400)
RBC: 2.84 MIL/uL — AB (ref 4.22–5.81)
RDW: 15.2 % (ref 11.5–15.5)
WBC: 13.1 10*3/uL — ABNORMAL HIGH (ref 4.0–10.5)

## 2014-06-21 LAB — BASIC METABOLIC PANEL
Anion gap: 9 (ref 5–15)
BUN: 12 mg/dL (ref 6–23)
CHLORIDE: 103 meq/L (ref 96–112)
CO2: 23 mmol/L (ref 19–32)
Calcium: 8.4 mg/dL (ref 8.4–10.5)
Creatinine, Ser: 0.76 mg/dL (ref 0.50–1.35)
GFR calc Af Amer: >90 mL/min (ref >90)
GFR, EST NON AFRICAN AMERICAN: 81 mL/min — AB (ref >90)
GLUCOSE: 102 mg/dL — AB (ref 70–99)
POTASSIUM: 3.8 mmol/L (ref 3.5–5.1)
SODIUM: 135 mmol/L (ref 135–145)

## 2014-06-21 MED ORDER — DSS 100 MG PO CAPS: 100.0000 mg | ORAL_CAPSULE | Freq: Two times a day (BID) | ORAL | Status: AC

## 2014-06-21 MED ORDER — DIPHENHYDRAMINE HCL 25 MG PO TABS: 50.0000 mg | ORAL_TABLET | Freq: Every evening | ORAL | Status: AC | PRN

## 2014-06-21 MED ORDER — HYDROCODONE-ACETAMINOPHEN 5-325 MG PO TABS: 1.0000 | ORAL_TABLET | Freq: Four times a day (QID) | ORAL | Status: DC | PRN

## 2014-06-21 MED ORDER — ZOLPIDEM TARTRATE 5 MG PO TABS: 5.0000 mg | ORAL_TABLET | Freq: Every day | ORAL | Status: DC

## 2014-06-21 MED ORDER — BICALUTAMIDE 50 MG PO TABS: 50.0000 mg | ORAL_TABLET | Freq: Every day | ORAL | Status: AC

## 2014-06-21 MED ORDER — BISACODYL 10 MG RE SUPP: 10.0000 mg | Freq: Every day | RECTAL | Status: AC | PRN

## 2014-06-21 MED ORDER — HYDROCODONE-ACETAMINOPHEN 5-325 MG PO TABS: 1.0000 | ORAL_TABLET | Freq: Four times a day (QID) | ORAL | Status: AC | PRN

## 2014-06-21 MED ORDER — ACETAMINOPHEN 325 MG PO TABS: 650.0000 mg | ORAL_TABLET | Freq: Four times a day (QID) | ORAL | Status: AC | PRN

## 2014-06-21 MED ORDER — POLYETHYLENE GLYCOL 3350 17 G PO PACK: 17.0000 g | PACK | Freq: Every day | ORAL | Status: AC

## 2014-06-21 MED ORDER — DIPHENHYDRAMINE HCL 50 MG PO CAPS: 50.0000 mg | ORAL_CAPSULE | Freq: Every day | ORAL | Status: DC

## 2014-06-21 MED ORDER — ONDANSETRON HCL 4 MG PO TABS: 4.0000 mg | ORAL_TABLET | Freq: Four times a day (QID) | ORAL | Status: AC | PRN

## 2014-06-21 NOTE — Progress Notes (Signed)
Medicare Important Message given? YES  (If response is "NO", the following Medicare IM given date fields will be blank)  Date Medicare IM given: 06/21/14 Medicare IM given by:  Khyli Swaim  

## 2014-06-21 NOTE — Clinical Social Work Note (Signed)
Patient to be d/c'ed today to [facility].  Patient and family agreeable to plans will transport via ems RN to call report.  Patient grateful for all the help provided. Evette Cristal, MSW, [place]

## 2014-06-21 NOTE — Progress Notes (Signed)
Report called to [facility]. No further questions from receiving RN.  Rowe Pavy, RN

## 2014-06-21 NOTE — Discharge Summary (Signed)
Physician Discharge Summary  [name] Ferrell HWE:[id number] DOB: [birth year] DOA: 06/13/2014  PCP: [name] Frees, MD  Admit date: 06/13/2014 Discharge date: 06/21/2014  Time spent: greater than 30 minutes  Recommendations for Outpatient Follow-up:  1. Will need tissue diagnosis/prostate biopsy to be arranged by urology after discharge, then probable referral to oncology 2. Going to SNF for rehab  Discharge Diagnoses:  Active Problems:   Urinary obstruction   Acute renal failure   Hyperkalemia   UTI (lower urinary tract infection)   Acute blood loss anemia   Hydronephrosis with ureteral stricture, not elsewhere classified progressive prostate cancer Deconditioning Benign hypertension  Discharge Condition: stable  Filed Weights   06/19/14 0516 06/20/14 1500 06/21/14 0544  Weight: 93.4 kg (205 lb 14.6 oz) 93.1 kg (205 lb 4 oz) 90.5 kg (199 lb 8.3 oz)    History of present illness/Hospital Course:  81 year old male patient with history of essential hypertension, recently seen in initial visit by urology for? Prostate cancer, presented to the ED with difficulty urinating and ankle swelling. In the ED, noted to be in urinary retention and past 2.5 L of dark brown urine following Foley placement and had abnormal labs: Creatinine 13.41 and potassium 6.5. He has had IR place nephrostomy tubes 12/30  Acute urinary retention with bilateral hydronephrosis: Likely secondary to urinary bladder base mass.  CT of the abdomen and pelvis showed Extensive local spread of prostate cancer throughout the pelvis with large heterogeneous mass invading the bladder base and seminal vesicles. There is extensive pelvic lymphadenopathy with resulting bilateral hydronephrosis. Foley catheter placed and patient will be discharged with catheter in place. Urology consulted. Patient found to have severe bilateral hydronephrosis.  IR consulted and nephrostomy tubes placed.  Patient will follow-up with urology as an  outpatient to schedule prostate biopsy. Started on Casodex. Bone scan without obvious evidence of bony metastases. I have left message with urology that patient is being discharged and they will arrange for outpatient follow-up.  Acute renal failure/hyperkalemia/anion gap metabolic acidosis: Secondary to obstructive uropathy from problem #1 and 2. Improving after Foley catheter placement. Metabolic acidosis and hyperkalemia resolved. Creatinine at baseline by discharge  Urinary tract infection: UA consistent with urinary tract infection, but culture negative. Received 7 days of IV Rocephin. No further need for antibiotic unless fever or other signs of UTI return.  Anemia: appears to be ABLA  -s/p 3 units PRBC- last PRBC 1/2, monitor.  At discharge, hemoglobin 8.0.  Essential hypertension:  Lisinopril held secondary to acute renal failure and hyperkalemia. Patient has been normotensive off lisinopril and will be held at discharge.  Deconditioning: Patient will go to short-term skilled nursing facility placement.  Code Status: Full  Consultants:  Urology  Interventional radiology  Procedures:  Foley catheter  Bilateral Nephrostomy tubes  Discharge Exam: Filed Vitals:   06/21/14 1221  BP:   Pulse: 116  Temp:   Resp:     General: Comfortable. Talkative. Cardiovascular: Regular rate rhythm without murmurs gallops rubs Respiratory: Clear to auscultation bilaterally without wheezes rhonchi or rales Back: Dressings clean dry and intact. Nephrostomy tubes draining pink tinged urine. GU: Foley catheter draining yellow urine Extremities no clubbing cyanosis or edema  Discharge Instructions   Discharge Instructions    Diet - low sodium heart healthy    Complete by:  As directed      Discharge instructions    Complete by:  As directed   Flush nephrostomy tubes with 10 cc sterile saline daily  Increase activity slowly    Complete by:  As directed      Walk with  assistance    Complete by:  As directed           Current Discharge Medication List    START taking these medications   Details  acetaminophen (TYLENOL) 325 MG tablet Take 2 tablets (650 mg total) by mouth every 6 (six) hours as needed for mild pain (or Fever >/= 101).    bicalutamide (CASODEX) 50 MG tablet Take 1 tablet (50 mg total) by mouth daily.    bisacodyl (DULCOLAX) 10 MG suppository Place 1 suppository (10 mg total) rectally daily as needed for moderate constipation. Qty: 12 suppository, Refills: 0    diphenhydrAMINE (BENADRYL) 25 MG tablet Take 2 tablets (50 mg total) by mouth at bedtime as needed for itching or sleep. Qty: 30 tablet, Refills: 0    docusate sodium 100 MG CAPS Take 100 mg by mouth 2 (two) times daily. Qty: 10 capsule, Refills: 0    HYDROcodone-acetaminophen (NORCO/VICODIN) 5-325 MG per tablet Take 1 tablet by mouth every 6 (six) hours as needed for moderate pain. Qty: 20 tablet, Refills: 0    ondansetron (ZOFRAN) 4 MG tablet Take 1 tablet (4 mg total) by mouth every 6 (six) hours as needed for nausea. Qty: 20 tablet, Refills: 0    polyethylene glycol (MIRALAX / GLYCOLAX) packet Take 17 g by mouth daily. Qty: 14 each, Refills: 0      CONTINUE these medications which have NOT CHANGED   Details  aspirin 81 MG tablet Take 81 mg by mouth daily.    Cholecalciferol (VITAMIN D3) 2000 UNITS TABS Take 2,000 Units by mouth daily.      STOP taking these medications     lisinopril (PRINIVIL,ZESTRIL) 10 MG tablet      naproxen sodium (ANAPROX) 550 MG tablet      OVER THE COUNTER MEDICATION        Allergies  Allergen Reactions  . Penicillins Hives    On bottom of feet   Follow-up Information    Follow up with TANNENBAUM, SIGMUND I, MD In 1 week.   Specialty:  Urology   Contact information:   [place] [id number] [phone]        The results of significant diagnostics from this hospitalization (including imaging,  microbiology, ancillary and laboratory) are listed below for reference.    Significant Diagnostic Studies: Ct Abdomen Pelvis Wo Contrast  06/15/2014   CLINICAL DATA:  81 year old with untreated prostate cancer for 20 years. Evaluate for osseous metastatic disease and pelvic lymphadenopathy. Initial encounter.  EXAM: CT ABDOMEN AND PELVIS WITHOUT CONTRAST  TECHNIQUE: Multidetector CT imaging of the abdomen and pelvis was performed following the standard protocol without IV contrast.  COMPARISON:  Abdominal ultrasound 06/14/2014. Report only from CT performed 03/26/2000.  FINDINGS: Lower chest: There is probable chronic pleural thickening in the right hemithorax with adjacent extrapleural fat deposition. There are calcified granulomas in both lung bases and mild subpleural reticulation.  Hepatobiliary: As evaluated in the noncontrast state, the liver appears normal without focal abnormality. No evidence of gallstones, gallbladder wall thickening or biliary dilatation.  Pancreas: Unremarkable. No pancreatic ductal dilatation or surrounding inflammatory changes.  Spleen: Normal in size without focal abnormality aside from small calcified granulomas.  Adrenals/Urinary Tract: Both adrenal glands appear normal.As demonstrated on ultrasound, there is bilateral hydronephrosis without evidence of urinary tract calculus. A 2.6 cm lesion projecting anteriorly from the upper pole  of the right kidney (best seen on the reformatted images) corresponds with a cyst on ultrasound. The bladder is thick walled and distended. There is a large heterogeneous mass within the bladder base which is probably due to a combination of direct extension of prostate cancer into the bladder lumen and adjacent blood clot. Foley catheter has been placed in the interval.  Stomach/Bowel: No evidence of bowel wall thickening, distention or surrounding inflammatory change.Moderate diverticular changes are present throughout the distal colon without  surrounding inflammatory change or extraluminal fluid collection.  Vascular/Lymphatic: There is extensive pelvic lymphadenopathy surrounding the heterogeneously enlarged prostate mass which extends into the bladder lumen. The individual nodes are difficult to differentiate from adjacent muscles without contrast, although measure approximately 7.2 x 4.4 cm on the right and 3.4 x 4.6 cm on the left (image 65). Additional nodes include a left internal iliac node measuring 1.9 cm short axis on image 62 and a right inguinal node measuring 2.1 cm on image 74. Moderate aortoiliac atherosclerosis noted.  Reproductive: As above, there is heterogeneous enlargement of the prostate gland with probable direct extension into the bladder lumen, seminal vesicles and surrounding pelvic lymph nodes. Because of the lack of intravenous contrast and presumed adjacent blood clot in the bladder lumen, the prostate gland is difficult to accurately measure, although measures approximately 9.3 x 8.4 cm transverse on image 71.  Other: There are postsurgical changes in the left inguinal region with phleboliths or surgical clips in both inguinal canals.  Musculoskeletal: No acute or significant osseous findings. There are degenerative changes throughout the spine without suspicious blastic lesion.  IMPRESSION: 1. Extensive local spread of prostate cancer throughout the pelvis with large heterogeneous mass invading the bladder base and seminal vesicles. There is extensive pelvic lymphadenopathy with resulting bilateral hydronephrosis. Foley catheter is in place with probable adjacent blood in the bladder lumen. The bladder remains distended. 2. No metastatic disease identified within the abdomen or bones.   Electronically Signed   By: Camie Patience M.D.   On: 06/15/2014 10:41   Nm Bone Scan Whole Body  06/17/2014   CLINICAL DATA:  Prostate malignancy. No additional history available  EXAM: NUCLEAR MEDICINE WHOLE BODY BONE SCAN  TECHNIQUE:  Whole body anterior and posterior images were obtained approximately 3 hours after intravenous injection of radiopharmaceutical.  RADIOPHARMACEUTICALS:  25 MCi Technetium-99 MDP  COMPARISON:  None  FINDINGS: There is adequate uptake of the radiopharmaceutical by the skeleton. Adequate soft tissue clearance and renal activity is demonstrated. There is some slowing of radio labeled urine over the left knee.  Uptake of the radiotracer over the calvarium and cervical spine is normal. There are foci of minimally increased uptake posteriorly at T7 and T10. There is mildly increased uptake at L1 and L2 demonstrated on the anterior images. Uptake over the pelvis is normal. There is mildly increased uptake about the ankles and mid feet most compatible with degenerative change. Uptake over the pectoral girdles is normal.  IMPRESSION: There are no findings highly suspicious for metastatic disease. Mildly increased uptake in the thoracic and lumbar spines is likely degenerative in nature. Plain films of the thoracic and lumbar spine would be useful.   Electronically Signed   By: David  Martinique   On: 06/17/2014 15:43   US Renal  06/14/2014   CLINICAL DATA:  Acute renal failure.  Hypertension.  EXAM: RENAL/URINARY TRACT ULTRASOUND COMPLETE  COMPARISON:  None  FINDINGS: Right Kidney:  Length: 14.1 cm cysts are identified measuring 0.9 cm  and 2.3 cm in the upper pole region. Echogenicity is normal. Moderate hydronephrosis is present.  Left Kidney:  Length: 13.4 cm. Echogenicity is normal. No solid or cystic mass. Moderate hydronephrosis is present.  Bladder:  Bladder wall thickened. There is an irregular solid and vascular mass within the base the bladder measuring 6.0 x 6.8 x 7 2 cm. The prostate gland appears enlarged measuring 5.2 x 4.4 x 5.8 cm. Is difficult to separate the mass at the basal bladder from the prostate gland.  IMPRESSION: 1. Bilateral hydronephrosis. 2. Mass at the base the bladder may be the cause  hydronephrosis and is suspicious for malignancy. Considerations include bladder or prostate primary. Blood clot is felt to be unlikely given the presence of blood flow within the mass. Further evaluation with contrast-enhanced CT of the abdomen and pelvis is recommended. If the patient is not a candidate for contrast, noncontrast CT of the abdomen and pelvis would be suggested. 3. Small right renal cysts, benign in their appearance. 4. These results will be called to the ordering clinician or representative by the Radiologist Assistant, and communication documented in the PACS or zVision Dashboard.   Electronically Signed   By: Shon Hale M.D.   On: 06/14/2014 08:20   Ir Perc Nephrostomy Left  06/16/2014   CLINICAL DATA:  Acute renal failure with bilateral obstructive uropathy at the level of the bladder and distal ureters secondary to progressive prostate carcinoma. The patient requires bilateral percutaneous nephrostomy tube placement.  EXAM: 1. ULTRASOUND GUIDANCE FOR PUNCTURE OF THE RIGHT RENAL COLLECTING SYSTEM. 2. RIGHT PERCUTANEOUS NEPHROSTOMY TUBE PLACEMENT. 3. ULTRASOUND GUIDANCE FOR PUNCTURE OF THE LEFT RENAL COLLECTING SYSTEM. 4. LEFT PERCUTANEOUS NEPHROSTOMY TUBE PLACEMENT.  COMPARISON:  CT OF THE ABDOMEN AND PELVIS ON 06/15/2014  ANESTHESIA/SEDATION: 2.0 mg IV Versed; 100 mcg IV Fentanyl.  Total Moderate Sedation Time  25 minutes  CONTRAST:  30 ml Omnipaque 300  MEDICATIONS: 400 mg IV Cipro. Ciprofloxacin was given within two hours of incision.  FLUOROSCOPY TIME:  2.0 minutes.  PROCEDURE: The procedure, risks, benefits, and alternatives were explained to the patient. Questions regarding the procedure were encouraged and answered. The patient understands and consents to the procedure.  Bilateral flank regions were prepped with Betadine in a sterile fashion, and a sterile drape was applied covering the operative field. A sterile gown and sterile gloves were used for the procedure. Local anesthesia  was provided with 1% Lidocaine. A time-out procedure was performed.  Ultrasound was used to localize both kidneys. Under direct ultrasound guidance, a 21 gauge needle was advanced into the right renal collecting system. Ultrasound image documentation was performed. Aspiration of urine sample was performed followed by contrast injection.  A transitional dilator was advanced over a guidewire. Percutaneous tract dilatation was then performed over the guidewire. A 10 French percutaneous nephrostomy tube was then advanced and formed in the collecting system. Catheter position was confirmed by fluoroscopy after contrast injection.  A 21 gauge needle was then advanced under ultrasound guidance into the left renal collecting system. Aspiration of urine was followed by contrast injection. A guidewire was advanced through the needle. A transitional dilator was placed. The tract was dilated and a 10 French nephrostomy tube advanced and formed in the collecting system. Catheter position confirmed by fluoroscopy after contrast injection.  Both catheters were secured at the skin with Prolene retention sutures and adhesive StatLock devices. Both were connected to gravity drainage bags.  COMPLICATIONS: None.  FINDINGS: Ultrasound confirms moderate to moderately severe hydronephrosis  bilaterally. Bilateral percutaneous nephrostomy tubes were able to be successfully placed with both catheters formed at the level of the renal pelvis. The collecting systems are partially duplicated bilaterally. Both tubes are draining well after placement.  IMPRESSION: Placement of bilateral 10 French percutaneous nephrostomy tubes to treat obstructive uropathy. Ten French catheters were placed and formed in the renal pelvis bilaterally. These were connected to gravity drainage bags. Urine output will be followed.   Electronically Signed   By: Aletta Edouard M.D.   On: 06/16/2014 17:21   Ir Perc Nephrostomy Right  06/16/2014   CLINICAL DATA:   Acute renal failure with bilateral obstructive uropathy at the level of the bladder and distal ureters secondary to progressive prostate carcinoma. The patient requires bilateral percutaneous nephrostomy tube placement.  EXAM: 1. ULTRASOUND GUIDANCE FOR PUNCTURE OF THE RIGHT RENAL COLLECTING SYSTEM. 2. RIGHT PERCUTANEOUS NEPHROSTOMY TUBE PLACEMENT. 3. ULTRASOUND GUIDANCE FOR PUNCTURE OF THE LEFT RENAL COLLECTING SYSTEM. 4. LEFT PERCUTANEOUS NEPHROSTOMY TUBE PLACEMENT.  COMPARISON:  CT OF THE ABDOMEN AND PELVIS ON 06/15/2014  ANESTHESIA/SEDATION: 2.0 mg IV Versed; 100 mcg IV Fentanyl.  Total Moderate Sedation Time  25 minutes  CONTRAST:  30 ml Omnipaque 300  MEDICATIONS: 400 mg IV Cipro. Ciprofloxacin was given within two hours of incision.  FLUOROSCOPY TIME:  2.0 minutes.  PROCEDURE: The procedure, risks, benefits, and alternatives were explained to the patient. Questions regarding the procedure were encouraged and answered. The patient understands and consents to the procedure.  Bilateral flank regions were prepped with Betadine in a sterile fashion, and a sterile drape was applied covering the operative field. A sterile gown and sterile gloves were used for the procedure. Local anesthesia was provided with 1% Lidocaine. A time-out procedure was performed.  Ultrasound was used to localize both kidneys. Under direct ultrasound guidance, a 21 gauge needle was advanced into the right renal collecting system. Ultrasound image documentation was performed. Aspiration of urine sample was performed followed by contrast injection.  A transitional dilator was advanced over a guidewire. Percutaneous tract dilatation was then performed over the guidewire. A 10 French percutaneous nephrostomy tube was then advanced and formed in the collecting system. Catheter position was confirmed by fluoroscopy after contrast injection.  A 21 gauge needle was then advanced under ultrasound guidance into the left renal collecting system.  Aspiration of urine was followed by contrast injection. A guidewire was advanced through the needle. A transitional dilator was placed. The tract was dilated and a 10 French nephrostomy tube advanced and formed in the collecting system. Catheter position confirmed by fluoroscopy after contrast injection.  Both catheters were secured at the skin with Prolene retention sutures and adhesive StatLock devices. Both were connected to gravity drainage bags.  COMPLICATIONS: None.  FINDINGS: Ultrasound confirms moderate to moderately severe hydronephrosis bilaterally. Bilateral percutaneous nephrostomy tubes were able to be successfully placed with both catheters formed at the level of the renal pelvis. The collecting systems are partially duplicated bilaterally. Both tubes are draining well after placement.  IMPRESSION: Placement of bilateral 10 French percutaneous nephrostomy tubes to treat obstructive uropathy. Ten French catheters were placed and formed in the renal pelvis bilaterally. These were connected to gravity drainage bags. Urine output will be followed.   Electronically Signed   By: Aletta Edouard M.D.   On: 06/16/2014 17:21   Ir US Guide Bx Asp/drain  06/16/2014   CLINICAL DATA:  Acute renal failure with bilateral obstructive uropathy at the level of the bladder and distal ureters secondary to  progressive prostate carcinoma. The patient requires bilateral percutaneous nephrostomy tube placement.  EXAM: 1. ULTRASOUND GUIDANCE FOR PUNCTURE OF THE RIGHT RENAL COLLECTING SYSTEM. 2. RIGHT PERCUTANEOUS NEPHROSTOMY TUBE PLACEMENT. 3. ULTRASOUND GUIDANCE FOR PUNCTURE OF THE LEFT RENAL COLLECTING SYSTEM. 4. LEFT PERCUTANEOUS NEPHROSTOMY TUBE PLACEMENT.  COMPARISON:  CT OF THE ABDOMEN AND PELVIS ON 06/15/2014  ANESTHESIA/SEDATION: 2.0 mg IV Versed; 100 mcg IV Fentanyl.  Total Moderate Sedation Time  25 minutes  CONTRAST:  30 ml Omnipaque 300  MEDICATIONS: 400 mg IV Cipro. Ciprofloxacin was given within two hours of  incision.  FLUOROSCOPY TIME:  2.0 minutes.  PROCEDURE: The procedure, risks, benefits, and alternatives were explained to the patient. Questions regarding the procedure were encouraged and answered. The patient understands and consents to the procedure.  Bilateral flank regions were prepped with Betadine in a sterile fashion, and a sterile drape was applied covering the operative field. A sterile gown and sterile gloves were used for the procedure. Local anesthesia was provided with 1% Lidocaine. A time-out procedure was performed.  Ultrasound was used to localize both kidneys. Under direct ultrasound guidance, a 21 gauge needle was advanced into the right renal collecting system. Ultrasound image documentation was performed. Aspiration of urine sample was performed followed by contrast injection.  A transitional dilator was advanced over a guidewire. Percutaneous tract dilatation was then performed over the guidewire. A 10 French percutaneous nephrostomy tube was then advanced and formed in the collecting system. Catheter position was confirmed by fluoroscopy after contrast injection.  A 21 gauge needle was then advanced under ultrasound guidance into the left renal collecting system. Aspiration of urine was followed by contrast injection. A guidewire was advanced through the needle. A transitional dilator was placed. The tract was dilated and a 10 French nephrostomy tube advanced and formed in the collecting system. Catheter position confirmed by fluoroscopy after contrast injection.  Both catheters were secured at the skin with Prolene retention sutures and adhesive StatLock devices. Both were connected to gravity drainage bags.  COMPLICATIONS: None.  FINDINGS: Ultrasound confirms moderate to moderately severe hydronephrosis bilaterally. Bilateral percutaneous nephrostomy tubes were able to be successfully placed with both catheters formed at the level of the renal pelvis. The collecting systems are partially  duplicated bilaterally. Both tubes are draining well after placement.  IMPRESSION: Placement of bilateral 10 French percutaneous nephrostomy tubes to treat obstructive uropathy. Ten French catheters were placed and formed in the renal pelvis bilaterally. These were connected to gravity drainage bags. Urine output will be followed.   Electronically Signed   By: Aletta Edouard M.D.   On: 06/16/2014 17:21   Ir US Guide Bx Asp/drain  06/16/2014   CLINICAL DATA:  Acute renal failure with bilateral obstructive uropathy at the level of the bladder and distal ureters secondary to progressive prostate carcinoma. The patient requires bilateral percutaneous nephrostomy tube placement.  EXAM: 1. ULTRASOUND GUIDANCE FOR PUNCTURE OF THE RIGHT RENAL COLLECTING SYSTEM. 2. RIGHT PERCUTANEOUS NEPHROSTOMY TUBE PLACEMENT. 3. ULTRASOUND GUIDANCE FOR PUNCTURE OF THE LEFT RENAL COLLECTING SYSTEM. 4. LEFT PERCUTANEOUS NEPHROSTOMY TUBE PLACEMENT.  COMPARISON:  CT OF THE ABDOMEN AND PELVIS ON 06/15/2014  ANESTHESIA/SEDATION: 2.0 mg IV Versed; 100 mcg IV Fentanyl.  Total Moderate Sedation Time  25 minutes  CONTRAST:  30 ml Omnipaque 300  MEDICATIONS: 400 mg IV Cipro. Ciprofloxacin was given within two hours of incision.  FLUOROSCOPY TIME:  2.0 minutes.  PROCEDURE: The procedure, risks, benefits, and alternatives were explained to the patient. Questions regarding the procedure were  encouraged and answered. The patient understands and consents to the procedure.  Bilateral flank regions were prepped with Betadine in a sterile fashion, and a sterile drape was applied covering the operative field. A sterile gown and sterile gloves were used for the procedure. Local anesthesia was provided with 1% Lidocaine. A time-out procedure was performed.  Ultrasound was used to localize both kidneys. Under direct ultrasound guidance, a 21 gauge needle was advanced into the right renal collecting system. Ultrasound image documentation was performed.  Aspiration of urine sample was performed followed by contrast injection.  A transitional dilator was advanced over a guidewire. Percutaneous tract dilatation was then performed over the guidewire. A 10 French percutaneous nephrostomy tube was then advanced and formed in the collecting system. Catheter position was confirmed by fluoroscopy after contrast injection.  A 21 gauge needle was then advanced under ultrasound guidance into the left renal collecting system. Aspiration of urine was followed by contrast injection. A guidewire was advanced through the needle. A transitional dilator was placed. The tract was dilated and a 10 French nephrostomy tube advanced and formed in the collecting system. Catheter position confirmed by fluoroscopy after contrast injection.  Both catheters were secured at the skin with Prolene retention sutures and adhesive StatLock devices. Both were connected to gravity drainage bags.  COMPLICATIONS: None.  FINDINGS: Ultrasound confirms moderate to moderately severe hydronephrosis bilaterally. Bilateral percutaneous nephrostomy tubes were able to be successfully placed with both catheters formed at the level of the renal pelvis. The collecting systems are partially duplicated bilaterally. Both tubes are draining well after placement.  IMPRESSION: Placement of bilateral 10 French percutaneous nephrostomy tubes to treat obstructive uropathy. Ten French catheters were placed and formed in the renal pelvis bilaterally. These were connected to gravity drainage bags. Urine output will be followed.   Electronically Signed   By: Aletta Edouard M.D.   On: 06/16/2014 17:21   Dg Abd Acute W/chest  06/13/2014   CLINICAL DATA:  Difficulty urinating for 2 days. Diarrhea for 3 days. Low abdominal pain.  EXAM: ACUTE ABDOMEN SERIES (ABDOMEN 2 VIEW & CHEST 1 VIEW)  COMPARISON:  Chest x-ray 10/18/2008  FINDINGS: There is hyperinflation of the lungs compatible with COPD. Chronic changes/ scarring in the  lungs. Heart is normal size. No acute airspace opacities or effusions.  Nonobstructive bowel gas pattern. No free air. No organomegaly or suspicious calcification.  No acute bony abnormality. Degenerative changes throughout the thoracolumbar spine.  IMPRESSION: No evidence of bowel obstruction or free air.  COPD/chronic changes in the lungs.  No active disease.   Electronically Signed   By: Rolm Baptise M.D.   On: 06/13/2014 22:45    Microbiology: Recent Results (from the past 240 hour(s))  Urine culture     Status: None   Collection Time: 06/13/14 10:00 PM  Result Value Ref Range Status   Specimen Description URINE, CATHETERIZED  Final   Special Requests NONE  Final   Colony Count NO GROWTH Performed at [facility]   Final   Culture NO GROWTH Performed at [facility]   Final   Report Status 06/15/2014 FINAL  Final     Labs: Basic Metabolic Panel:  Recent Labs Lab 06/17/14 0254 06/18/14 0332 06/19/14 0025 06/20/14 0343 06/21/14 0440  NA 136 137 136 132* 135  K 4.5 4.1 3.9 4.0 3.8  CL 111 111 106 103 103  CO2 17* 18* 23 22 23   GLUCOSE 110* 94 88 104* 102*  BUN 46* 15  11 13 12   CREATININE 2.26* 0.74 0.70 0.73 0.76  CALCIUM 7.9* 7.8* 7.9* 8.1* 8.4   Liver Function Tests: No results for input(s): AST, ALT, ALKPHOS, BILITOT, PROT, ALBUMIN in the last 168 hours. No results for input(s): LIPASE, AMYLASE in the last 168 hours. No results for input(s): AMMONIA in the last 168 hours. CBC:  Recent Labs Lab 06/17/14 0254  06/18/14 0332 06/18/14 1622 06/19/14 0025 06/20/14 0343 06/21/14 0440  WBC 18.9*  --  14.5*  --  13.9* 16.5* 13.1*  HGB 6.6*  < > 7.4* 7.4* 7.3* 8.2* 8.0*  HCT 19.8*  < > 21.6* 21.5* 21.3* 23.9* 24.3*  MCV 90.0  --  85.4  --  85.9 84.5 85.6  PLT 207  --  171  --  180 221 253  < > = values in this interval not displayed. Cardiac Enzymes: No results for input(s): CKTOTAL, CKMB, CKMBINDEX, TROPONINI in the last 168 hours. BNP: BNP  (last 3 results) No results for input(s): PROBNP in the last 8760 hours. CBG:  Recent Labs Lab 06/15/14 0817 06/15/14 1206 06/15/14 1628  GLUCAP 100* 99 95       Signed:  [place]  [facility] 06/21/2014, 2:39 PM

## 2014-06-21 NOTE — Care Management Note (Signed)
    Page 1 of 1   06/21/2014     4:48:17 PM CARE MANAGEMENT NOTE 06/21/2014  Patient:  [name] Ferrell   Account Number:  [id number]  Date Initiated:  06/17/2014  Documentation initiated by:  Marvetta Gibbons  Subjective/Objective Assessment:   Pt admitted with urinary retention     Action/Plan:   PTA pt lived at home- PT eval   Anticipated DC Date:  06/19/2014   Anticipated DC Plan:  [place]  In-house referral  Clinical Social Worker      DC Planning Services  CM consult      Choice offered to / List presented to:             Status of service:  Completed, signed off Medicare Important Message given?  YES (If response is "NO", the following Medicare IM given date fields will be blank) Date Medicare IM given:  06/17/2014 Medicare IM given by:  Marvetta Gibbons Date Additional Medicare IM given:  06/21/2014 Additional Medicare IM given by:  Marvetta Gibbons  Discharge Disposition:  [place]  Per UR Regulation:  Reviewed for med. necessity/level of care/duration of stay  If discussed at [place] of Stay Meetings, dates discussed:    Comments:  06/21/14- 26- [place], BSN [phone] Spoke with pt regarding d/c plans- per conversation pt wants to look at [facility] as option he has knowledge about [facility] services and wife is currently in [facility]- pt would be alone at home- and feels like it would be too much for him-children live out of state and are not here to assist- spoke with both son and daughter who agree with plan for [facility]SNF- CSW aware and working on placement

## 2014-06-21 NOTE — Clinical Social Work Placement (Signed)
Clinical Social Work Department CLINICAL SOCIAL WORK PLACEMENT NOTE 06/21/2014  Patient:  ZACARI, STIFF  Account Number:  [id number] Admit date:  06/13/2014  Clinical Social Worker:  Aubriee Szeto, LCSWA  Date/time:  06/18/2014 04:27 PM  Clinical Social Work is seeking post-discharge placement for this patient at the following level of care:   SKILLED NURSING   (*CSW will update this form in Epic as items are completed)   06/18/2014  Patient/family provided with [place] Department of Clinical Social Work's list of facilities offering this level of care within the geographic area requested by the patient (or if unable, by the patient's family).  06/18/2014  Patient/family informed of their freedom to choose among providers that offer the needed level of care, that participate in Medicare, Medicaid or managed care program needed by the patient, have an available bed and are willing to accept the patient.  06/18/2014  Patient/family informed of [facility]' ownership interest in [facility], as well as of the fact that they are under no obligation to receive care at this facility.  PASARR submitted to EDS on 06/18/2014 PASARR number received on 06/18/2014  FL2 transmitted to all facilities in geographic area requested by pt/family on  06/21/2014 FL2 transmitted to all facilities within larger geographic area on 06/21/2014  Patient informed that his/her managed care company has contracts with or will negotiate with  certain facilities, including the following:     Patient/family informed of bed offers received:  06/21/2014 Patient chooses bed at [facility] Physician recommends and patient chooses bed at    Patient to be transferred to [facility] on  06/21/2014 Patient to be transferred to facility by [facility] Patient and family notified of transfer on 06/21/2014 Name of family member notified:  Daughter Sonia Baller  The following physician  request were entered in Epic:   Additional Comments:  [name]. [place], MSW, [place] 06/21/2014 5:30 PM

## 2014-06-21 NOTE — Progress Notes (Signed)
   06/21/14 1300  Clinical Encounter Type  Visited With Patient and family together  Visit Type Initial  Referral From Family   Chaplain was paged at 1:38 PM to the patient's room for help regarding an advanced directive. Patient's family members were visiting and indicated that the paperwork had not yet been completed and that the patient would like to look over the paperwork further. Chaplain answered questions that family had regarding the contents of the advanced directive. Please contact spiritual care department if patient desires to complete the advanced directive. Shacarra Choe, Claudius Sis, Chaplain  1:40 PM

## 2014-06-21 NOTE — Progress Notes (Signed)
Physical Therapy Treatment Patient Details Name: [name] Ferrell MRN: [id number] DOB: [birth year] Today's Date: 06/21/2014    History of Present Illness 81 y.o. male admitted with Acute renal failure/hyperkalemia/anion gap metabolic acidosis: Secondary to obstructive uropathy. Found to have Mass at urinary bladder base. Hx of prostate cancer.Bilateral hydronephrosis from obstructive uropathy/prostate cancer S/p (B)PCN on 06/16/14.      PT Comments    Pt is not progressing as quickly as anticipated on initial PT assessment. Pt re assessed today and presented as a high fall risk due to slow gait speed and instability when up on his feet. He has significantly decreased endurance and activity tolerance.  He would benefit from SNF for rehab prior to return home to independent living environment.  PT will continue to follow acutely to progress mobility.  Follow Up Recommendations  SNF     Equipment Recommendations  None recommended by PT    Recommendations for Other Services   NA     Precautions / Restrictions Precautions Precautions: Fall Precaution Comments: Pt with heavy reliance on upper extremities for balance during gait and dynamic activities.      Mobility  Bed Mobility Overal bed mobility: Needs Assistance Bed Mobility: Supine to Sit     Supine to sit: Supervision     General bed mobility comments: Extra time needed due to fatigue and weakness.  Pt pulling heavily on bed rail to get to sitting. Reinforced that rolling may be less painful and easier to do to get up instead of swinging his legs over and relying sole on his abdominal strength (which is poor).   Transfers Overall transfer level: Needs assistance Equipment used: Rolling walker (2 wheeled) Transfers: Sit to/from Stand Sit to Stand: Min assist Stand pivot transfers: Min assist       General transfer comment: Min assist from low bed, again, with heavy reliance on upper extremity support. He is unable to stand  at all without using his arms (see Berg).  Min assist to support trunk to power up during transitions and help control descent to sit.   Ambulation/Gait Ambulation/Gait assistance: Min assist Ambulation Distance (Feet): 85 Feet Assistive device: Rolling walker (2 wheeled);1 person hand held assist;None Gait Pattern/deviations: Step-through pattern;Shuffle Gait velocity: 1.23 ft/sec Gait velocity interpretation: <1.8 ft/sec, indicative of risk for recurrent falls General Gait Details: Pt with slow, shuffling gait speed with RW. He can be min assist with RW during gait, but when he lets go for short distance gait in his room he needs min assist (one person hand held assist) and is reaching for stability from furniture with his free hand.  He did not use an assistive device for gait PTA.           Balance Overall balance assessment: Needs assistance Sitting-balance support: Feet supported;No upper extremity supported Sitting balance-Leahy Scale: Good     Standing balance support: No upper extremity supported;Bilateral upper extremity supported;Single extremity supported Standing balance-Leahy Scale: Fair                      Cognition Arousal/Alertness: Awake/alert Behavior During Therapy: WFL for tasks assessed/performed Overall Cognitive Status: Within Functional Limits for tasks assessed                         General Comments General comments (skin integrity, edema, etc.): Unable to complete the Berg due to pt fatigue.  DOE with gait 3/4, O2 sats 98% on RA and HR  112 bpm (up from 89 at rest).  Pt with very poor endurance.       Pertinent Vitals/Pain Pain Assessment: No/denies pain           PT Goals (current goals can now be found in the care plan section) Acute Rehab PT Goals Patient Stated Goal: to go to rehab to get stronger before going home.  He is fearful he cannot care for himself in his current state.  Progress towards PT goals: Progressing  toward goals (slowly)    Frequency  Min 2X/week    PT Plan Discharge plan needs to be updated;Frequency needs to be updated       End of Session Equipment Utilized During Treatment: Gait belt Activity Tolerance: Patient limited by fatigue Patient left: in chair;with call bell/phone within reach     Time: 1207-1230 PT Time Calculation (min) (ACUTE ONLY): 23 min  Charges:  $Gait Training: 8-22 mins $Neuromuscular Re-education: 8-22 mins                      Elaine Roanhorse B. Tomeshia Pizzi, PT, DPT [phone]   06/21/2014, 12:39 PM

## 2014-06-21 NOTE — Progress Notes (Signed)
Chart reviewed. Patient examined.  Stable for discharge but patient requesting SNF placement if qualifies.  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 Per RN, very unsteady on feet. Multiple tubes, etc.  Will reconsult PT to see whether pt would qualify for SNF.  If so, consult SW. If not, discharge home today with [facility].  Doree Barthel, MD. [facility] [email or web address]

## 2014-06-21 NOTE — Progress Notes (Signed)
Referring Physician(s): Tannenbaum  Subjective:  B PCNs placed 12/30 Hx prostate ca B hydronephrosis Pt doing well Bun/Cr wnl Wbc better daily Denies pain  Allergies: Penicillins  Medications: Prior to Admission medications   Medication Sig Start Date End Date Taking? Authorizing Provider  aspirin 81 MG tablet Take 81 mg by mouth daily.   Yes Historical Provider, MD  Cholecalciferol (VITAMIN D3) 2000 UNITS TABS Take 2,000 Units by mouth daily.   Yes Historical Provider, MD  lisinopril (PRINIVIL,ZESTRIL) 10 MG tablet Take 10 mg by mouth daily.   Yes Historical Provider, MD  naproxen sodium (ANAPROX) 550 MG tablet Take 550 mg by mouth 2 (two) times daily with a meal.   Yes Historical Provider, MD  OVER THE COUNTER MEDICATION Take 1 tablet by mouth daily. ubiqunol   Yes Historical Provider, MD    Review of Systems  Vital Signs: BP 109/50 mmHg  Pulse 81  Temp(Src) 97.4 F (36.3 C) (Oral)  Resp 18  Ht 5\' 11"  (1.803 m)  Wt 90.5 kg (199 lb 8.3 oz)  BMI 27.84 kg/m2  SpO2 97%  Physical Exam  Skin: Skin is warm and dry.  B PCNs site clean and dry; NT No bleeding Rt output 775 cc 06/20/14 ; blood tinged Lt output 675 cc 06/20/14; blood tinged  Bun/Cr wnl Wbc 13.1 (16.5) afeb vss     Imaging: Nm Bone Scan Whole Body  06/17/2014   CLINICAL DATA:  Prostate malignancy. No additional history available  EXAM: NUCLEAR MEDICINE WHOLE BODY BONE SCAN  TECHNIQUE: Whole body anterior and posterior images were obtained approximately 3 hours after intravenous injection of radiopharmaceutical.  RADIOPHARMACEUTICALS:  25 MCi Technetium-99 MDP  COMPARISON:  None  FINDINGS: There is adequate uptake of the radiopharmaceutical by the skeleton. Adequate soft tissue clearance and renal activity is demonstrated. There is some slowing of radio labeled urine over the left knee.  Uptake of the radiotracer over the calvarium and cervical spine is normal. There are foci of minimally increased uptake  posteriorly at T7 and T10. There is mildly increased uptake at L1 and L2 demonstrated on the anterior images. Uptake over the pelvis is normal. There is mildly increased uptake about the ankles and mid feet most compatible with degenerative change. Uptake over the pectoral girdles is normal.  IMPRESSION: There are no findings highly suspicious for metastatic disease. Mildly increased uptake in the thoracic and lumbar spines is likely degenerative in nature. Plain films of the thoracic and lumbar spine would be useful.   Electronically Signed   By: David  [place]   On: 06/17/2014 15:43    Labs:  CBC:  Recent Labs  06/18/14 0332 06/18/14 1622 06/19/14 0025 06/20/14 0343 06/21/14 0440  WBC 14.5*  --  13.9* 16.5* 13.1*  HGB 7.4* 7.4* 7.3* 8.2* 8.0*  HCT 21.6* 21.5* 21.3* 23.9* 24.3*  PLT 171  --  180 221 253    COAGS:  Recent Labs  06/16/14 1144  INR 1.64*    BMP:  Recent Labs  06/18/14 0332 06/19/14 0025 06/20/14 0343 06/21/14 0440  NA 137 136 132* 135  K 4.1 3.9 4.0 3.8  CL 111 106 103 103  CO2 18* 23 22 23   GLUCOSE 94 88 104* 102*  BUN 15 11 13 12   CALCIUM 7.8* 7.9* 8.1* 8.4  CREATININE 0.74 0.70 0.73 0.76  GFRNONAA 82* 84* 83* 81*  GFRAA >90 >90 >90 >90    LIVER FUNCTION TESTS:  Recent Labs  06/13/14 2121 06/14/14 0909  BILITOT 1.6* 1.5*  AST 16 13  ALT 18 17  ALKPHOS 88 74  PROT 6.4 5.7*  ALBUMIN 2.7* 2.3*    Assessment and Plan:  B PCNs intact Doing well Output great Still sl blood tinged urine Bun/Cr wnl Plan per Uro---for dc today possibly    I spent a total of 15 minutes face to face in clinical consultation/evaluation, greater than 50% of which was counseling/coordinating care for B PCNs  Signed: Charley Miske A 06/21/2014, 9:28 AM

## 2014-07-19 DEATH — deceased

## 2016-03-29 IMAGING — US US RENAL
1 series · 13 of 25 positions shown · non-contrast
Comparison: None

CLINICAL DATA: Acute renal failure.  Hypertension.

EXAM:
RENAL/URINARY TRACT ULTRASOUND COMPLETE

[Series 1: us renal · 0.25mm/px · 13 of 62 slices shown]
[im 1/62]
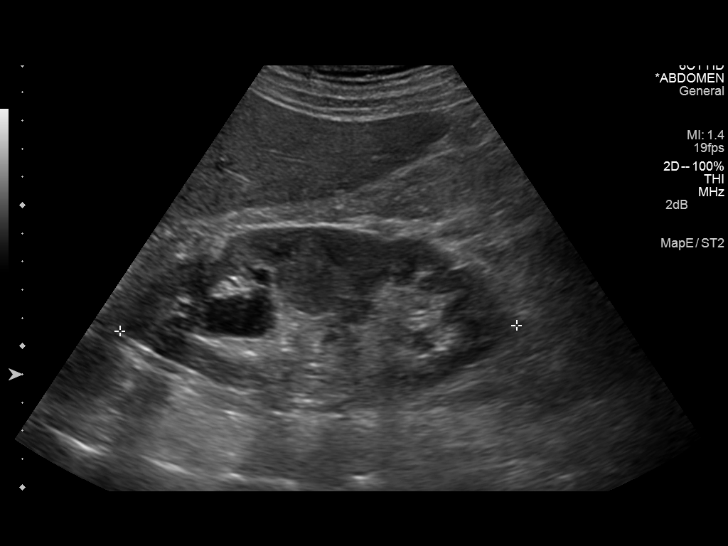
[im 6/62]
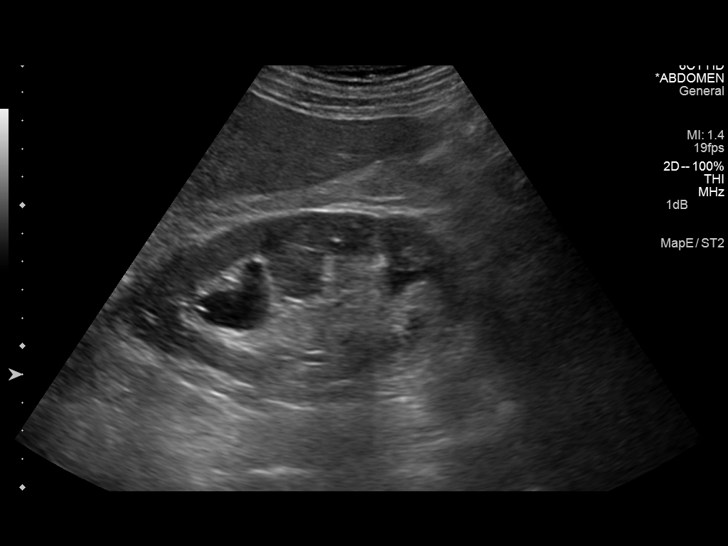
[im 11/62]
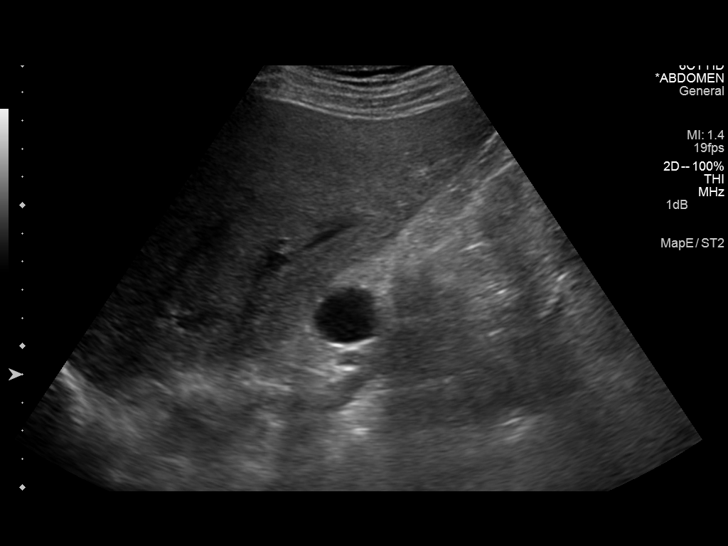
[im 16/62]
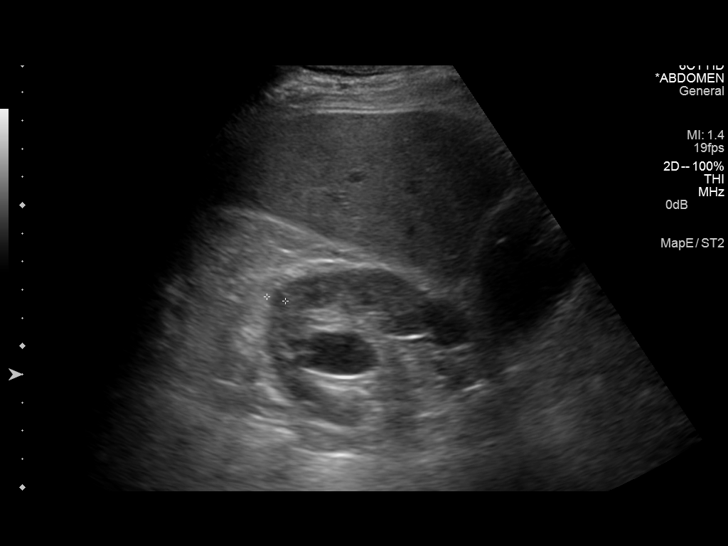
[im 21/62]
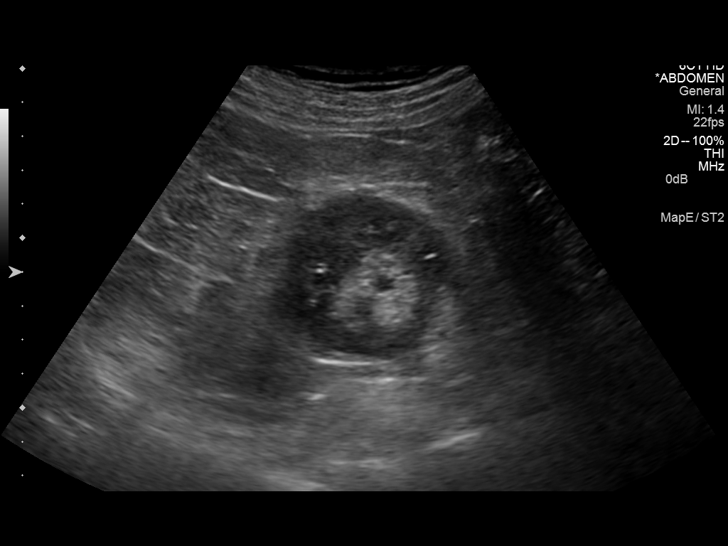
[im 26/62]
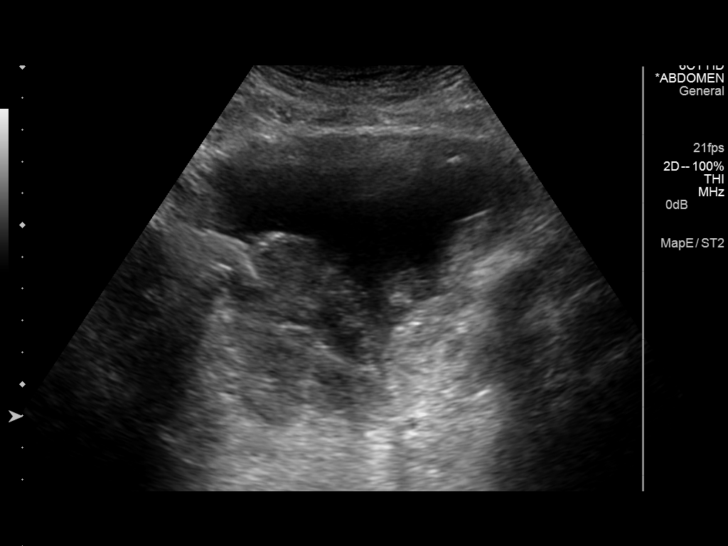
[im 31/62]
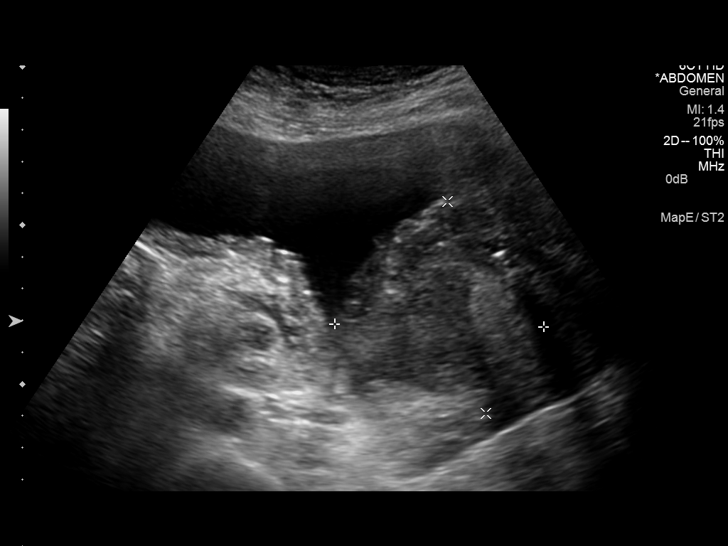
[im 36/62]
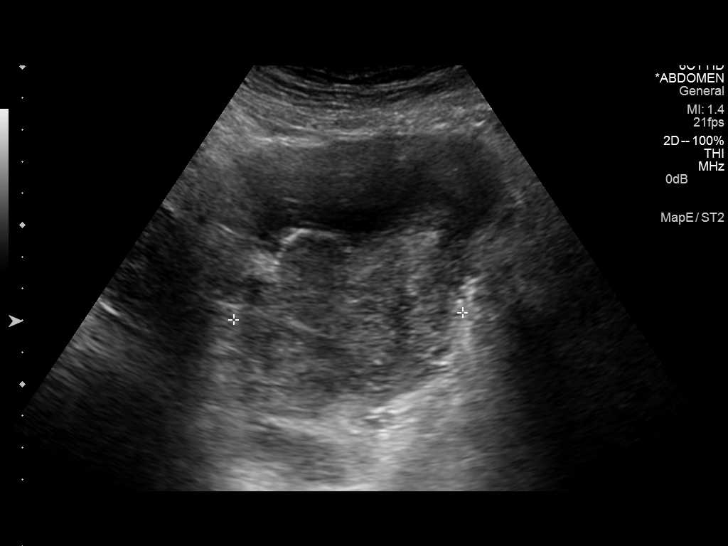
[im 41/62]
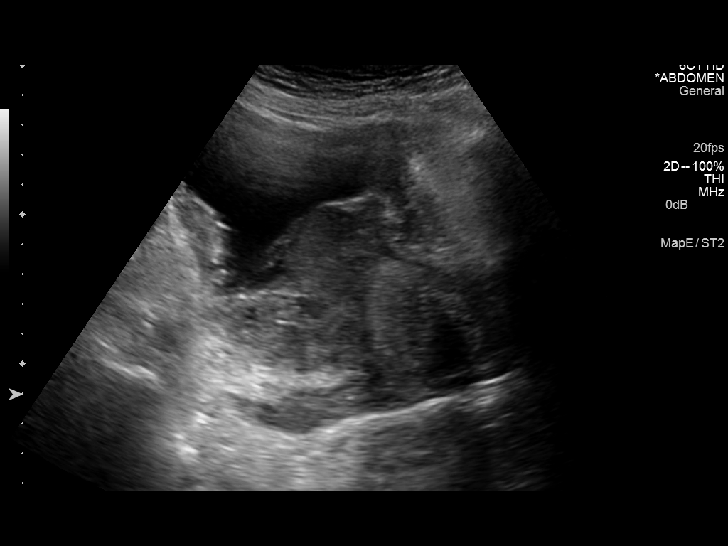
[im 46/62]
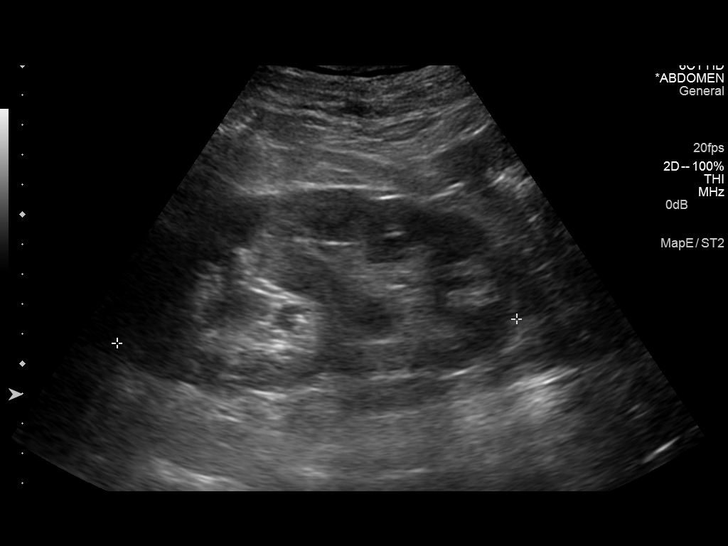
[im 51/62]
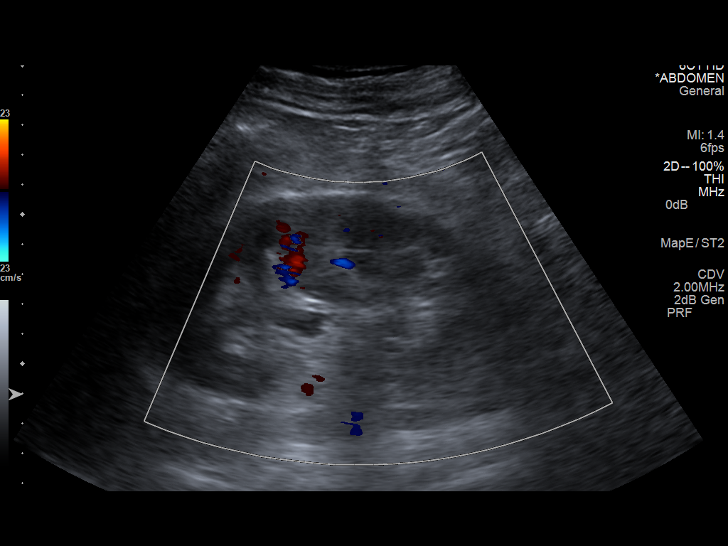
[im 56/62]
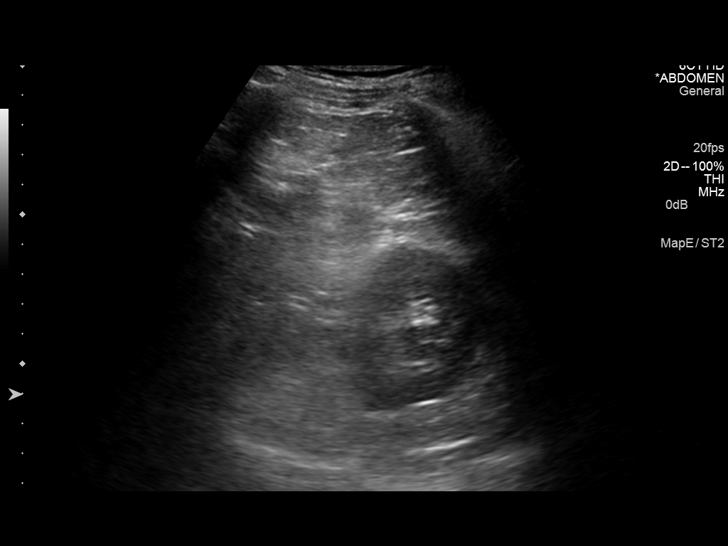
[im 62/62]
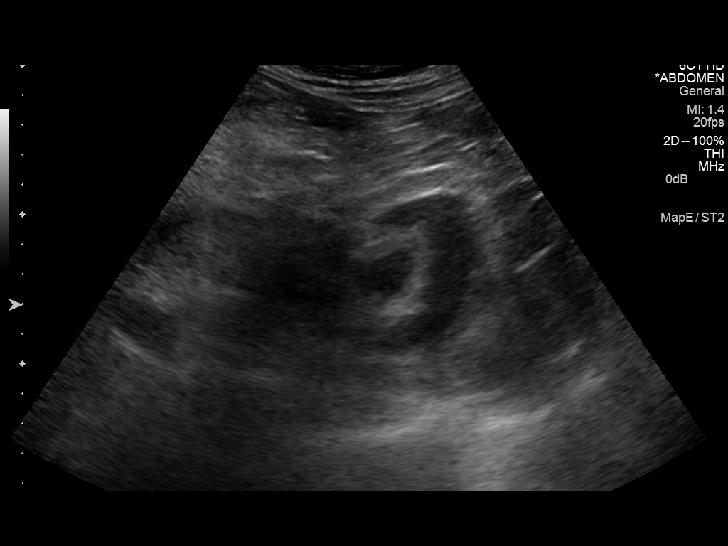

[13 of 25 positions shown; findings below may reference images not displayed]

FINDINGS: Right Kidney:

Length: 14.1 cm cysts are identified measuring 0.9 cm and 2.3 cm in
the upper pole region.. Echogenicity is normal. Moderate
hydronephrosis is present.

Left Kidney:

Length: 13.4 cm. Echogenicity is normal. No solid or cystic mass.
Moderate hydronephrosis is present.

Bladder:

Bladder wall thickened. There is an irregular solid and vascular
mass within the base the bladder measuring 6.0 x 6.8 x 7 2 cm. The
prostate gland appears enlarged measuring 5.2 x 4.4 x 5.8 cm. Is
difficult to separate the mass at the basal bladder from the
prostate gland.
IMPRESSION: 1. Bilateral hydronephrosis.
2. Mass at the base the bladder may be the cause hydronephrosis and
is suspicious for malignancy. Considerations include bladder or
prostate primary. Blood clot is felt to be unlikely given the
presence of blood flow within the mass. Further evaluation with
contrast-enhanced CT of the abdomen and pelvis is recommended. If
the patient is not a candidate for contrast, noncontrast CT of the
abdomen and pelvis would be suggested.
3. Small right renal cysts, benign in their appearance.
4. These results will be called to the ordering clinician or
representative by the Radiologist Assistant, and communication
documented in the PACS or zVision Dashboard.

## 2016-03-30 IMAGING — CT CT ABD-PELV W/O CM
2 of 4 series · 7 of 46 positions shown, 8 images · non-contrast
Comparison: Abdominal ultrasound 06/14/2014. Report only from CT
performed 03/26/2000.

CLINICAL DATA: 86-year-old with untreated prostate cancer for 20
years. Evaluate for osseous metastatic disease and pelvic
lymphadenopathy. Initial encounter.

EXAM:
CT ABDOMEN AND PELVIS WITHOUT CONTRAST
TECHNIQUE: Multidetector CT imaging of the abdomen and pelvis was performed
following the standard protocol without IV contrast.

[Series 201: routine, idose (2) · axial · 0.87mm/px · z∈[-509,-124]mm · 4 of 103 slices shown, 5 images]
[im 13/103  soft-tissue]
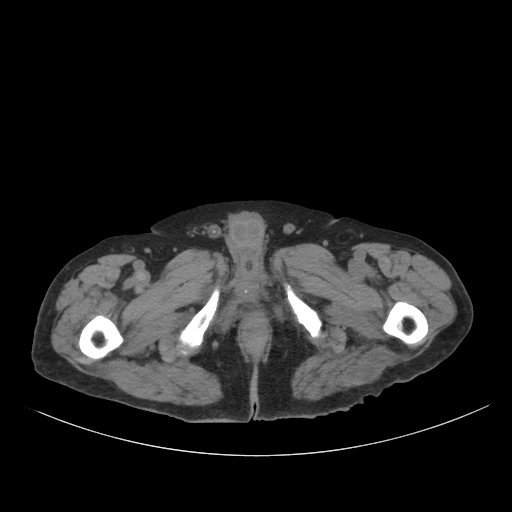
[im 13/103  bone]
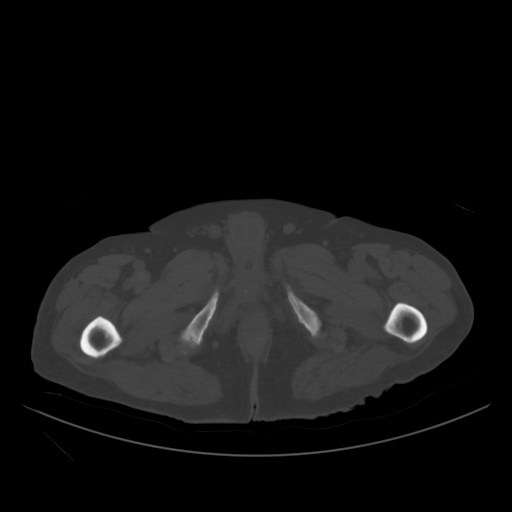
[im 39/103  soft-tissue]
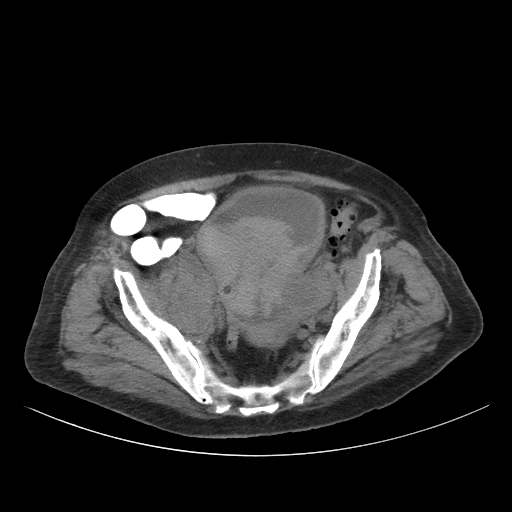
[im 64/103  soft-tissue]
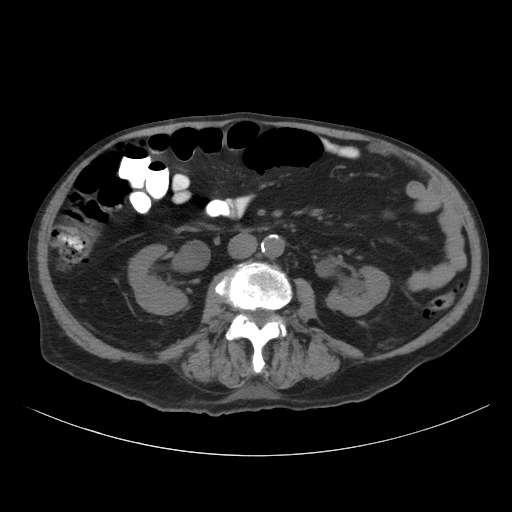
[im 90/103  soft-tissue]
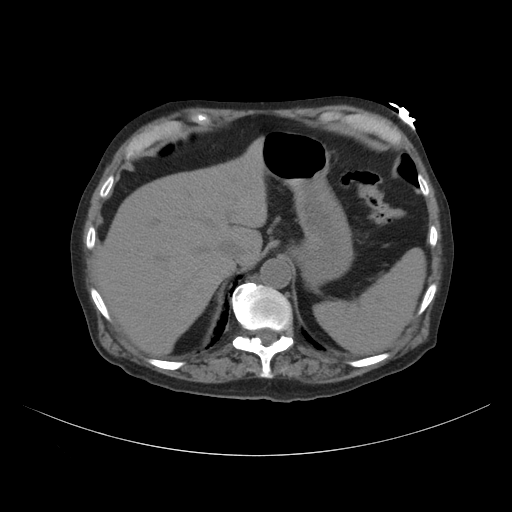

[Series 202: coronals, idose (2) · coronal · 0.45mm/px · 3 of 124 slices shown]
[im 42/124  soft-tissue]
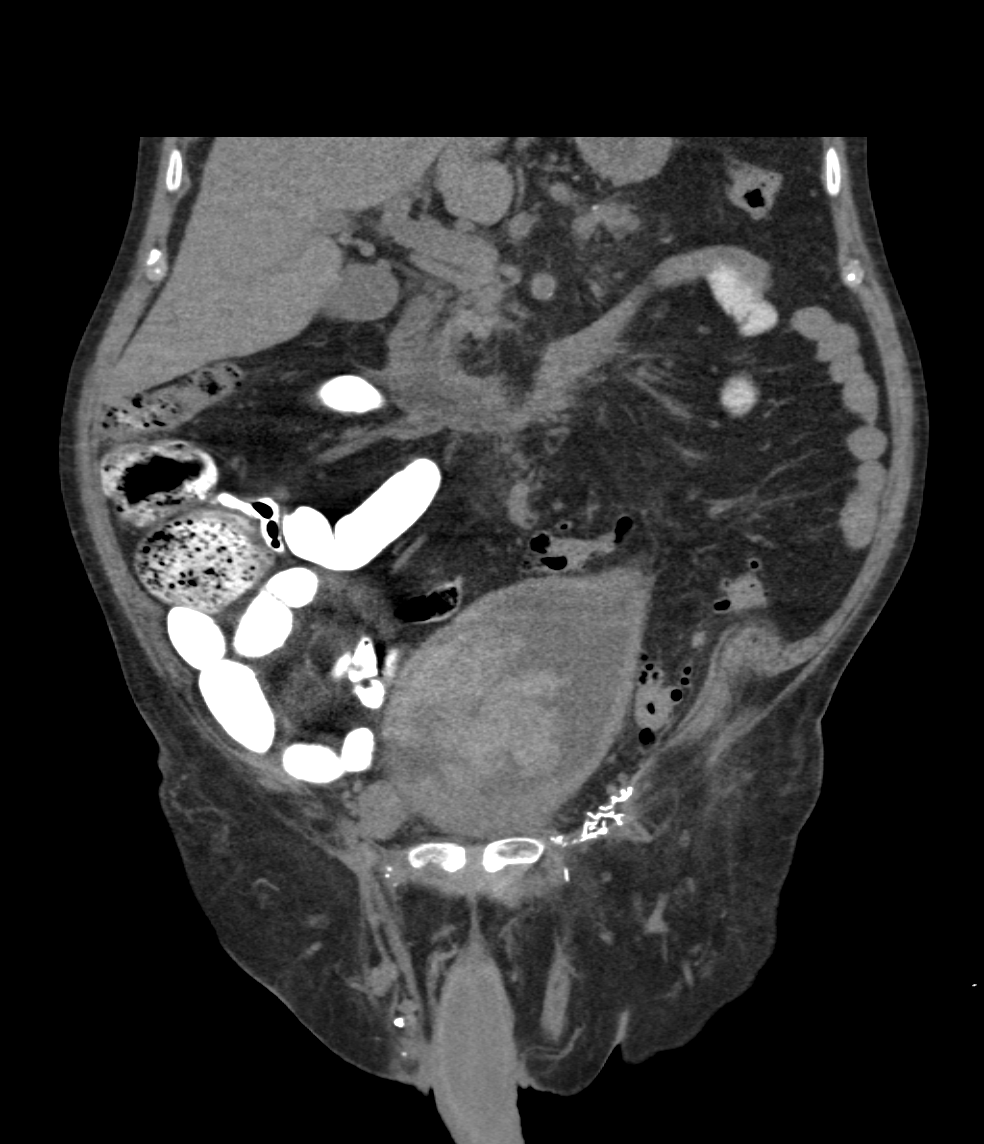
[im 55/124  soft-tissue]
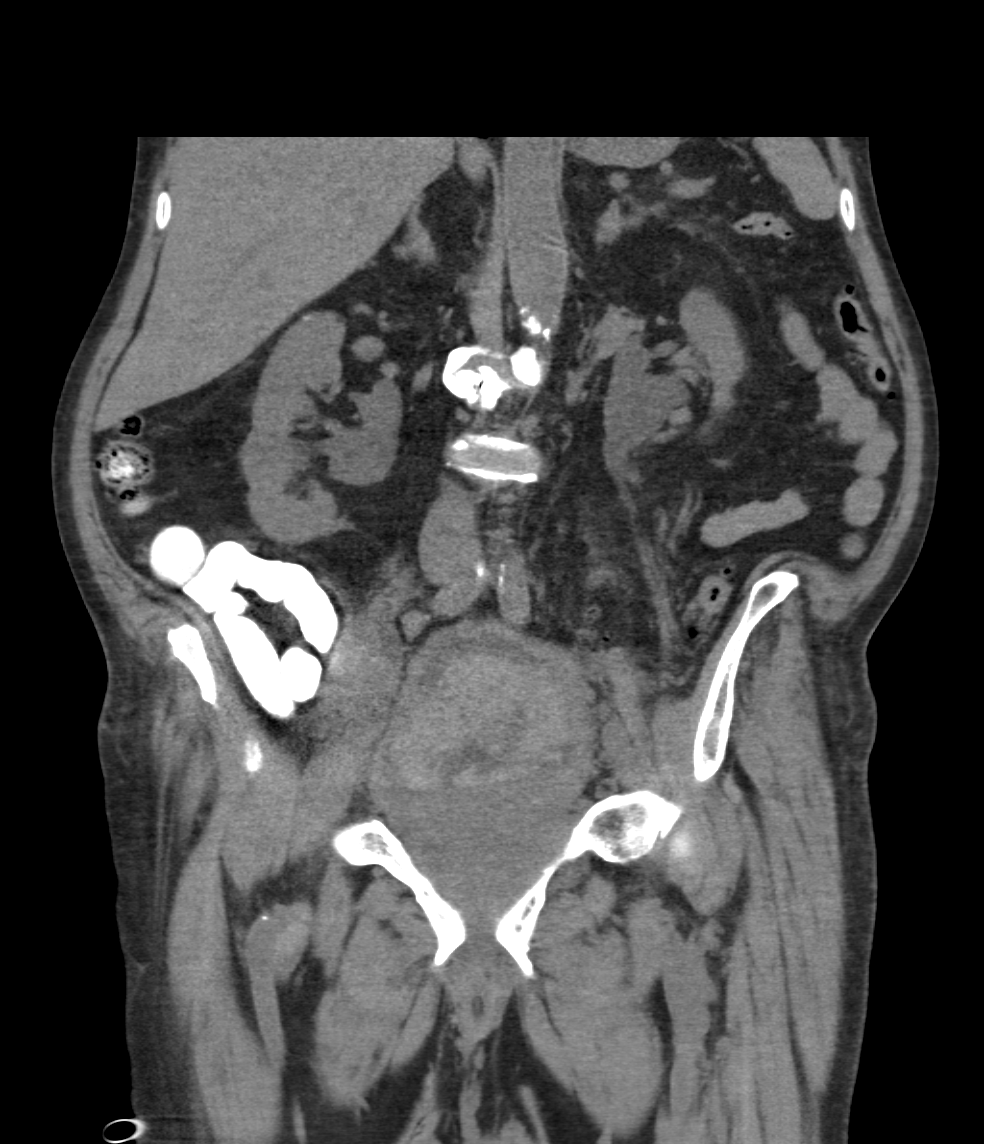
[im 69/124  soft-tissue]
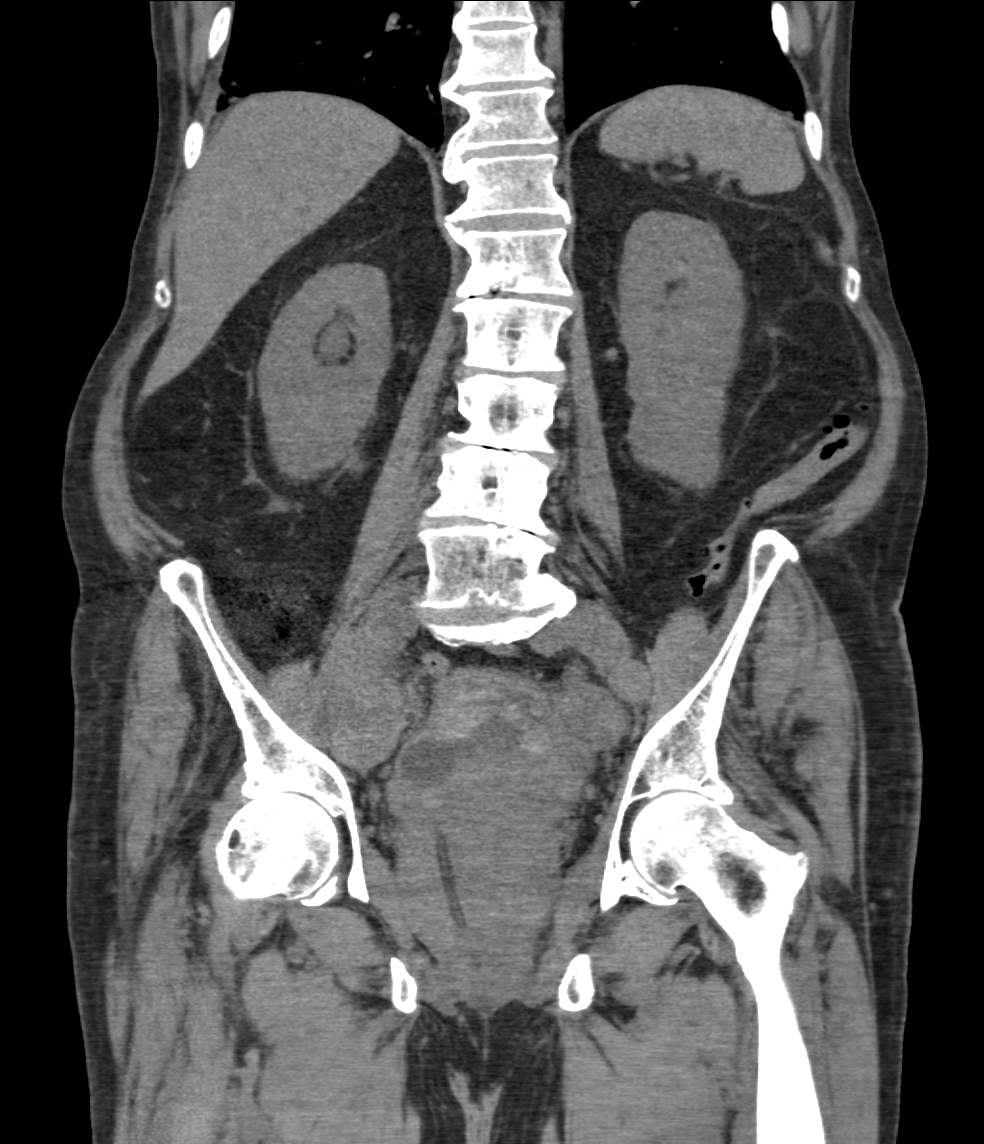

[7 of 46 positions shown; findings below may reference images not displayed]

FINDINGS: Lower chest: There is probable chronic pleural thickening in the
right hemithorax with adjacent extrapleural fat deposition. There
are calcified granulomas in both lung bases and mild subpleural
reticulation.

Hepatobiliary: As evaluated in the noncontrast state, the liver
appears normal without focal abnormality. No evidence of gallstones,
gallbladder wall thickening or biliary dilatation.

Pancreas: Unremarkable. No pancreatic ductal dilatation or
surrounding inflammatory changes.

Spleen: Normal in size without focal abnormality aside from small
calcified granulomas.

Adrenals/Urinary Tract: Both adrenal glands appear normal.As
demonstrated on ultrasound, there is bilateral hydronephrosis
without evidence of urinary tract calculus. A 2.6 cm lesion
projecting anteriorly from the upper pole of the right kidney (best
seen on the reformatted images) corresponds with a cyst on
ultrasound. The bladder is thick walled and distended. There is a
large heterogeneous mass within the bladder base which is probably
due to a combination of direct extension of prostate cancer into the
bladder lumen and adjacent blood clot. Foley catheter has been
placed in the interval.

Stomach/Bowel: No evidence of bowel wall thickening, distention or
surrounding inflammatory change.Moderate diverticular changes are
present throughout the distal colon without surrounding inflammatory
change or extraluminal fluid collection.

Vascular/Lymphatic: There is extensive pelvic lymphadenopathy
surrounding the heterogeneously enlarged prostate mass which extends
into the bladder lumen. The individual nodes are difficult to
differentiate from adjacent muscles without contrast, although
measure approximately 7.2 x 4.4 cm on the right and 3.4 x 4.6 cm on
the left (image 65). Additional nodes include a left internal iliac
node measuring 1.9 cm short axis on image 62 and a right inguinal
node measuring 2.1 cm on image 74. Moderate aortoiliac
atherosclerosis noted.

Reproductive: As above, there is heterogeneous enlargement of the
prostate gland with probable direct extension into the bladder
lumen, seminal vesicles and surrounding pelvic lymph nodes. Because
of the lack of intravenous contrast and presumed adjacent blood clot
in the bladder lumen, the prostate gland is difficult to accurately
measure, although measures approximately 9.3 x 8.4 cm transverse on
image 71.

Other: There are postsurgical changes in the left inguinal region
with phleboliths or surgical clips in both inguinal canals.

Musculoskeletal: No acute or significant osseous findings. There are
degenerative changes throughout the spine without suspicious blastic
lesion.
IMPRESSION: 1. Extensive local spread of prostate cancer throughout the pelvis
with large heterogeneous mass invading the bladder base and seminal
vesicles. There is extensive pelvic lymphadenopathy with resulting
bilateral hydronephrosis. Foley catheter is in place with probable
adjacent blood in the bladder lumen. The bladder remains distended.
2. No metastatic disease identified within the abdomen or bones.
